# Patient Record
Sex: Male | Born: 1940
Health system: Southern US, Community
[De-identification: ages and names within clinical notes are randomized; demographics above are authoritative.]

## PROBLEM LIST (undated history)

## (undated) DIAGNOSIS — K219 Gastro-esophageal reflux disease without esophagitis: Secondary | ICD-10-CM

## (undated) DIAGNOSIS — K429 Umbilical hernia without obstruction or gangrene: Secondary | ICD-10-CM

## (undated) DIAGNOSIS — I2510 Atherosclerotic heart disease of native coronary artery without angina pectoris: Secondary | ICD-10-CM

## (undated) DIAGNOSIS — I739 Peripheral vascular disease, unspecified: Secondary | ICD-10-CM

## (undated) DIAGNOSIS — I779 Disorder of arteries and arterioles, unspecified: Secondary | ICD-10-CM

## (undated) DIAGNOSIS — E785 Hyperlipidemia, unspecified: Secondary | ICD-10-CM

## (undated) DIAGNOSIS — Z8711 Personal history of peptic ulcer disease: Secondary | ICD-10-CM

## (undated) DIAGNOSIS — N489 Disorder of penis, unspecified: Secondary | ICD-10-CM

## (undated) DIAGNOSIS — Z8719 Personal history of other diseases of the digestive system: Secondary | ICD-10-CM

## (undated) DIAGNOSIS — Z87828 Personal history of other (healed) physical injury and trauma: Secondary | ICD-10-CM

## (undated) DIAGNOSIS — E669 Obesity, unspecified: Secondary | ICD-10-CM

## (undated) DIAGNOSIS — Z8739 Personal history of other diseases of the musculoskeletal system and connective tissue: Secondary | ICD-10-CM

## (undated) DIAGNOSIS — I10 Essential (primary) hypertension: Secondary | ICD-10-CM

## (undated) HISTORY — DX: Obesity, unspecified: E66.9

## (undated) HISTORY — PX: CORONARY ANGIOPLASTY WITH STENT PLACEMENT: SHX49

## (undated) HISTORY — DX: Atherosclerotic heart disease of native coronary artery without angina pectoris: I25.10

## (undated) HISTORY — PX: OTHER SURGICAL HISTORY: SHX169

## (undated) HISTORY — DX: Essential (primary) hypertension: I10

## (undated) HISTORY — PX: CARDIOVASCULAR STRESS TEST: SHX262

---

## 1997-07-23 HISTORY — PX: OTHER SURGICAL HISTORY: SHX169

## 2010-08-21 HISTORY — PX: TRANSTHORACIC ECHOCARDIOGRAM: SHX275

## 2011-05-18 ENCOUNTER — Encounter: Payer: Self-pay | Admitting: Cardiovascular Disease

## 2011-05-21 ENCOUNTER — Ambulatory Visit (INDEPENDENT_AMBULATORY_CARE_PROVIDER_SITE_OTHER): Payer: 59 | Admitting: Cardiovascular Disease

## 2011-05-21 ENCOUNTER — Encounter: Payer: Self-pay | Admitting: Cardiovascular Disease

## 2011-05-21 DIAGNOSIS — I10 Essential (primary) hypertension: Secondary | ICD-10-CM

## 2011-05-21 DIAGNOSIS — I2510 Atherosclerotic heart disease of native coronary artery without angina pectoris: Secondary | ICD-10-CM | POA: Insufficient documentation

## 2011-05-21 DIAGNOSIS — E7800 Pure hypercholesterolemia, unspecified: Secondary | ICD-10-CM

## 2011-05-21 DIAGNOSIS — E785 Hyperlipidemia, unspecified: Secondary | ICD-10-CM

## 2011-05-21 NOTE — Progress Notes (Signed)
HPI:  This is a 70 year old gentleman presenting to establish care. The patient has coronary artery disease and underwent PCI earlier this year. I don't have details of this but he tells me he was treated with a drug-eluting stent. This was done in [facility]. The patient presented with exertional dyspnea and he underwent stress testing by his primary cardiologist in [facility]. Apparently the stress test was abnormal and he underwent cardiac catheterization and PCI. He was told he had a second blockage that may need treatment, but it sounds by his description that it was a small blood vessel involved.  The patient denies exertional chest pain or pressure. He has not appreciated any significant improvement in his breathing since his PCI procedure was done. He denies edema, palpitations, orthopnea, PND, lightheadedness, or syncope. He reports good control of his blood pressure and cholesterol. Home blood pressures have ranged in the 120s and 130s over 80s. He denies any recent change in his medications and he reports good compliance with taking his medicines. He does not participate in any regular exercise.  Outpatient Encounter Prescriptions as of 05/21/2011  Medication Sig Dispense Refill  . amLODipine (NORVASC) 5 MG tablet Take 5 mg by mouth daily.        Marland Kitchen aspirin 81 MG tablet Take 81 mg by mouth daily.        . carvedilol (COREG) 12.5 MG tablet Take 12.5 mg by mouth 2 (two) times daily with a meal.        . glimepiride (AMARYL) 4 MG tablet Take 4 mg by mouth daily before breakfast.        . lisinopril (PRINIVIL,ZESTRIL) 20 MG tablet Take 20 mg by mouth 2 (two) times daily.        . metFORMIN (GLUCOPHAGE-XR) 500 MG 24 hr tablet Take 500 mg by mouth 3 (three) times daily.        . nitroGLYCERIN (NITROSTAT) 0.4 MG SL tablet Place 0.4 mg under the tongue every 5 (five) minutes as needed.        Marland Kitchen omeprazole (PRILOSEC) 40 MG capsule Take 40 mg by mouth daily.        . prasugrel  (EFFIENT) 10 MG TABS Take 10 mg by mouth.        . simvastatin (ZOCOR) 20 MG tablet Take 20 mg by mouth at bedtime.        . colchicine 0.6 MG tablet Take 0.6 mg by mouth as needed.         Review of patient's allergies indicates no known allergies.  Past Medical History  Diagnosis Date  . Diabetes mellitus     type 2  . Hypertension   . Obesity   . Esophageal reflux   . Coronary artery disease     Past Surgical History  Procedure Date  . Coronary angioplasty with stent placement   . Coronary stent placement     History   Social History  . Marital Status: Married    Spouse Name: N/A    Number of Children: N/A  . Years of Education: N/A   Occupational History  . Not on file.   Social History Main Topics  . Smoking status: Never Smoker   . Smokeless tobacco: Not on file  . Alcohol Use: No  . Drug Use: No  . Sexually Active: Not on file   Other Topics Concern  . Not on file   Social History Narrative  . No narrative on file  No family history on file.  ROS: General: no fevers/chills/night sweats Eyes: no blurry vision, diplopia, or amaurosis ENT: no sore throat or hearing loss Resp: no cough, wheezing, or hemoptysis CV: no edema or palpitations GI: no abdominal pain, nausea, vomiting, diarrhea, or constipation GU: no dysuria, frequency, or hematuria Skin: no rash Neuro: no headache, numbness, tingling, or weakness of extremities Musculoskeletal: no joint pain or swelling Heme: no bleeding, DVT, or easy bruising Endo: no polydipsia or polyuria  BP 170/80  Pulse 58  Resp 18  Ht 5\' 8"  (1.727 m)  Wt 222 lb 12.8 oz (101.061 kg)  BMI 33.88 kg/m2  PHYSICAL EXAM: Pt is alert and oriented, WD, WN, very nice overweight male in no distress. HEENT: normal Neck: JVP normal. Carotid upstrokes normal without bruits. No thyromegaly. Lungs: equal expansion, clear bilaterally CV: Apex is discrete and nondisplaced, RRR without murmur or gallop Abd: soft, NT,  +BS, no bruit, no hepatosplenomegaly Back: no CVA tenderness Ext: no C/C/E        Femoral pulses 2+= without bruits        DP/PT pulses intact and = Skin: warm and dry without rash Neuro: CNII-XII intact             Strength intact = bilaterally  EKG:  Sinus bradycardia 58 beats per minute, within normal limits.  ASSESSMENT AND PLAN:

## 2011-05-21 NOTE — Assessment & Plan Note (Signed)
Lipids were reviewed from Dr. Elenor Legato office. Cholesterol is 126, triglycerides 139, HDL 28, and LDL 70. He should continue on simvastatin as his LDL is at goal. There is really no good data for additional pharmacotherapy to raise HDL in patients already on a statin drug.

## 2011-05-21 NOTE — Assessment & Plan Note (Signed)
Office blood pressure is elevated but patient notes good readings at home. He is on a combination of amlodipine, carvedilol, and lisinopril.

## 2011-05-21 NOTE — Assessment & Plan Note (Signed)
The patient is on appropriate medical program. He appears to have good control of his cardiac risk factors. His blood pressure is elevated he office today but his home readings appear to be in a good range. I suspect he has a component of whitecoat hypertension (note this is his first visit here).    The We will send for his outside records. We have requested office records and stress test results from his primary cardiologist in [place], [place]. We have also requested catheterization reports and films from his PCI procedure from the interventional cardiologist in [place]. I would like to see the patient back for followup evaluation after the studies can be reviewed.

## 2011-05-21 NOTE — Patient Instructions (Signed)
Your physician recommends that you schedule a follow-up appointment in: January 2013  Your physician recommends that you continue on your current medications as directed. Please refer to the Current Medication list given to you today.

## 2011-05-23 ENCOUNTER — Telehealth: Payer: Self-pay | Admitting: Cardiovascular Disease

## 2011-05-23 NOTE — Telephone Encounter (Addendum)
ROi faxed to [facility] @ [phone], spoke with Morrie Sheldon she will fax records 05/23/11/km  Records received from [facility] gave to [facility]  05/23/11/km  ROI faxed to [facility] @ [phone]  05/28/11/km  Records received from [facility] gave to [place]  06/01/11/km

## 2011-07-30 ENCOUNTER — Encounter: Payer: Self-pay | Admitting: Cardiovascular Disease

## 2011-07-30 ENCOUNTER — Ambulatory Visit (INDEPENDENT_AMBULATORY_CARE_PROVIDER_SITE_OTHER): Payer: Medicare Other | Admitting: Cardiovascular Disease

## 2011-07-30 DIAGNOSIS — I2510 Atherosclerotic heart disease of native coronary artery without angina pectoris: Secondary | ICD-10-CM

## 2011-07-30 DIAGNOSIS — I10 Essential (primary) hypertension: Secondary | ICD-10-CM

## 2011-07-30 MED ORDER — AMLODIPINE BESYLATE 10 MG PO TABS: 10.0000 mg | ORAL_TABLET | Freq: Every day | ORAL | Status: DC

## 2011-07-30 NOTE — Progress Notes (Signed)
HPI:  This is a 71 year old gentleman presenting for followup evaluation. The patient has coronary artery disease and underwent coronary stenting and 2011. He is approaching one year out from his PCI procedure went he was treated with a drug-eluting stent and large obtuse marginal branch of the left circumflex. The patient was also noted to have moderately tight stenosis of a diagonal branch and medical therapy was recommended.  The patient continues to do well from a symptomatic perspective. He denies chest pain or pressure. He has mild dyspnea which is chronic and unchanged. He denies edema, orthopnea, or PND. He does home blood pressures with his wrist cuff had been in a good range with systolic readings less than 140.  I have received records from his cardiologist in [place] and these have been reviewed. The patient had a stress test in February 2012 demonstrating posterolateral ischemia and underwent PCI of the left circumflex as outlined above. We had requested that a catheterization films but these have not been received to date.  Outpatient Encounter Prescriptions as of 07/30/2011  Medication Sig Dispense Refill  . amLODipine (NORVASC) 5 MG tablet Take 5 mg by mouth daily.        Marland Kitchen aspirin 81 MG tablet Take 81 mg by mouth daily.        . carvedilol (COREG) 12.5 MG tablet Take 12.5 mg by mouth 2 (two) times daily with a meal.        . colchicine 0.6 MG tablet Take 0.6 mg by mouth as needed.       Marland Kitchen glimepiride (AMARYL) 4 MG tablet Take 4 mg by mouth daily before breakfast.        . lisinopril (PRINIVIL,ZESTRIL) 20 MG tablet Take 20 mg by mouth 2 (two) times daily.        . metFORMIN (GLUCOPHAGE-XR) 500 MG 24 hr tablet Take 500 mg by mouth 3 (three) times daily.        . nitroGLYCERIN (NITROSTAT) 0.4 MG SL tablet Place 0.4 mg under the tongue every 5 (five) minutes as needed.        Marland Kitchen omeprazole (PRILOSEC) 40 MG capsule Take 40 mg by mouth daily.        . prasugrel (EFFIENT) 10 MG TABS  Take 10 mg by mouth.        . simvastatin (ZOCOR) 20 MG tablet Take 20 mg by mouth at bedtime.          No Known Allergies  Past Medical History  Diagnosis Date  . Diabetes mellitus     type 2  . Hypertension   . Obesity   . Esophageal reflux   . Coronary artery disease     ROS: Negative except as per HPI  BP 150/80  Pulse 58  Ht 5' 7.5" (1.715 m)  Wt 103.329 kg (227 lb 12.8 oz)  BMI 35.15 kg/m2  PHYSICAL EXAM: Pt is alert and oriented, overweight male in NAD HEENT: normal Neck: JVP - normal, carotids 2+= without bruits Lungs: CTA bilaterally CV: RRR without murmur or gallop Abd: soft, NT, Positive BS, no hepatomegaly Ext: no C/C/E, distal pulses intact and equal Skin: warm/dry no rash  ASSESSMENT AND PLAN:

## 2011-07-30 NOTE — Assessment & Plan Note (Signed)
The patient is stable without anginal symptoms. He will continue effient to complete 12 months of therapy and then can discontinue this medication. He should remain on aspirin 81 mg. We sent for his catheterization films again. I will plan on seeing him back in 12 months for followup evaluation.

## 2011-07-30 NOTE — Assessment & Plan Note (Signed)
Blood pressure is elevated on consecutive office visits. I recommended that he increase amlodipine from 5-10 mg daily. We also recommended that he obtain an arm cuff for home monitoring.

## 2011-07-30 NOTE — Patient Instructions (Signed)
Your physician has recommended you make the following change in your medication: INCREASE Amlodipine to 10mg  once a day, You can stop EFFIENT when you finish all of the refills on your bottle  Your physician has requested that you regularly monitor and record your blood pressure readings at home. Please use the same machine at the same time of day to check your readings and record them to bring to your follow-up visit.  Please get a BP cuff to use on your arm, not the wrist.   Your physician wants you to follow-up in: 1 YEAR.  You will receive a reminder letter in the mail two months in advance. If you don't receive a letter, please call our office to schedule the follow-up appointment.

## 2011-07-31 ENCOUNTER — Telehealth: Payer: Self-pay | Admitting: Cardiovascular Disease

## 2011-07-31 NOTE — Telephone Encounter (Addendum)
ROI faxed to Michelle/Cardiac Cath Dept @ [phone] to [facility] Last Cath to be put on CD for [facility] Review 07/31/11/KM  Cd received of Cath Gave to Lauren 08/01/11/KM

## 2011-08-14 LAB — HM COLONOSCOPY: HM COLON: NORMAL

## 2011-10-16 ENCOUNTER — Telehealth: Payer: Self-pay | Admitting: Cardiovascular Disease

## 2011-10-16 DIAGNOSIS — I10 Essential (primary) hypertension: Secondary | ICD-10-CM

## 2011-10-16 DIAGNOSIS — I2510 Atherosclerotic heart disease of native coronary artery without angina pectoris: Secondary | ICD-10-CM

## 2011-10-16 MED ORDER — AMLODIPINE BESYLATE 5 MG PO TABS: 5.0000 mg | ORAL_TABLET | Freq: Every day | ORAL | Status: DC

## 2011-10-16 NOTE — Telephone Encounter (Signed)
I spoke with the pt and on average his BP is running 120-135/80.  The pt cannot tolerate the swelling in his lower extremities since increasing his amlodipine dosage.  The pt would like to go back to Amlodipine 5mg  daily.  New Rx sent to pharmacy due to the pt having difficulty cutting the 10mg .  I instructed the pt that he needs to monitor his BP at home and he will call back next week to let us know how his BP is running.  I made the pt aware that we would like his BP consistently below 140/90 and that he may require further adjustments in his BP medications. Pt agreed with plan.

## 2011-10-16 NOTE — Telephone Encounter (Signed)
New Msg: Pt calling wanting to speak with nurse/MD regarding pt recent amlodipine increase and feet swelling. Pt wants to know if MD can decrease RX back down to 5 mg. If MD approved pt to decrease amlodipine back down to 5mg , then pt needs new RX called into pharmacy.  Please return pt call to discuss further

## 2011-11-05 ENCOUNTER — Telehealth: Payer: Self-pay | Admitting: Cardiovascular Disease

## 2011-11-05 MED ORDER — CARVEDILOL 25 MG PO TABS: 25.0000 mg | ORAL_TABLET | Freq: Two times a day (BID) | ORAL | Status: DC

## 2011-11-05 NOTE — Telephone Encounter (Addendum)
Called patient and he reported the following BP readings: 4/12 165/70 172/74 162/68 4/13 171/68 4/14 175/83 146/70  Heart rates are in the mid 70's. Advised will discuss with MD and call him back.  Discussed above with Dr.Brackbill (DOD) and he advised that patient increase Coreg to 25mg  2 times per day and call us back with BP and HR readings in a few weeks. Patient verbalized understanding of the above information. Will let Dr.Cooper know per patient's request.

## 2011-11-05 NOTE — Telephone Encounter (Signed)
New Problem:     Patient called in to give his BP readings to Lauren.  Please call back.

## 2011-12-19 ENCOUNTER — Telehealth: Payer: Self-pay | Admitting: Cardiovascular Disease

## 2011-12-19 DIAGNOSIS — I10 Essential (primary) hypertension: Secondary | ICD-10-CM

## 2011-12-19 MED ORDER — HYDROCHLOROTHIAZIDE 25 MG PO TABS: 12.5000 mg | ORAL_TABLET | Freq: Every day | ORAL | Status: DC

## 2011-12-19 NOTE — Telephone Encounter (Signed)
I will forward this message to Dr Excell Seltzer to review BP readings. Per 11/05/11 phone note Dr Patty Sermons increased the pt's Coreg to 25mg  twice a day and instructed the pt to continue to monitor BP.

## 2011-12-19 NOTE — Telephone Encounter (Signed)
Pt did not get dates but he said he will start from today and you can go backwards  144/68 154/74 160/65 159/71 171/75 [id number] 150/71 145/66 147/70 158/68 160/70 145/66 136/73 b/p monitor reports heart skipping 166/72 157/71 151/73 b/p monitor reports heart skipping 168/72

## 2011-12-19 NOTE — Telephone Encounter (Signed)
Blood pressure readings are consistently elevated. He has not tolerated increased doses of amlodipine. Recommend add hydrochlorothiazide 12.5 mg daily. The patient needs to be counseled about potential for worsening of gout, but okay to start this medicine as long as his gout is controlled. He should have a metabolic panel checked again in 2 weeks.

## 2011-12-19 NOTE — Telephone Encounter (Signed)
I spoke with the pt and made him aware that Dr Excell Seltzer would like him to start HCTZ 12.5mg  daily.  The pt said he has not had a flare-up of gout in over a year.  The pt will start HCTZ and have a BMP checked on 01/03/12.  Rx sent to pharmacy. The pt will continue to monitor his BP at home.

## 2012-01-03 ENCOUNTER — Other Ambulatory Visit (INDEPENDENT_AMBULATORY_CARE_PROVIDER_SITE_OTHER): Payer: Medicare Other

## 2012-01-03 DIAGNOSIS — I10 Essential (primary) hypertension: Secondary | ICD-10-CM

## 2012-01-03 LAB — BASIC METABOLIC PANEL
CO2: 24 mEq/L (ref 19–32)
Calcium: 8.9 mg/dL (ref 8.4–10.5)
GFR: 89.06 mL/min (ref >60.00)
Potassium: 4.1 mEq/L (ref 3.5–5.1)
Sodium: 141 mEq/L (ref 135–145)

## 2012-01-14 ENCOUNTER — Telehealth: Payer: Self-pay

## 2012-01-14 DIAGNOSIS — I10 Essential (primary) hypertension: Secondary | ICD-10-CM

## 2012-01-14 NOTE — Telephone Encounter (Signed)
I spoke with the pt about his BMP results.  The pt's BP is still elevated on HCTZ: 6/24 151/66 6/22 139/65 6/21 172/72 6/20 149/69 6/11 142/71 6/9 151/70 6/7 140/65 and 161/74 6/6 156/68 6/4 161/77 6/2 162/75  I instructed the pt to increase his HCTZ to 25mg  daily.  The pt will continue to monitor his BP at home and call the office back in 2 weeks with BP readings.  Pt agreed with plan.

## 2012-01-14 NOTE — Telephone Encounter (Signed)
Agree with plan - thx.  Tonny Bollman 01/14/2012 9:37 PM

## 2012-02-12 ENCOUNTER — Telehealth: Payer: Self-pay | Admitting: Cardiovascular Disease

## 2012-02-12 DIAGNOSIS — I10 Essential (primary) hypertension: Secondary | ICD-10-CM

## 2012-02-12 MED ORDER — HYDROCHLOROTHIAZIDE 25 MG PO TABS: 25.0000 mg | ORAL_TABLET | Freq: Every day | ORAL | Status: DC

## 2012-02-12 NOTE — Telephone Encounter (Signed)
He is maxed on beta-blocker, ACE, and HCTZ and cannot tolerate a higher dose of amlodipine. Recommend schedule a routine OV to review med options from here.

## 2012-02-12 NOTE — Telephone Encounter (Signed)
New msg Pt wants to talk to you about his BP

## 2012-02-12 NOTE — Telephone Encounter (Signed)
I spoke with the pt and his BP has improved since increasing HCTZ to 25mg  daily.    BP Readings: 146/62 155/67 153/67 129/62 152/64 157/64 165/74 143/68 164/69 159/64   The pt's BP still remains above 140/90.  I will forward this information to Dr Excell Seltzer to make further recommendations.

## 2012-02-25 NOTE — Telephone Encounter (Signed)
Pt scheduled to see Dr Excell Seltzer on 03/04/12 to discuss medications.

## 2012-02-28 ENCOUNTER — Other Ambulatory Visit: Payer: Self-pay | Admitting: Urology

## 2012-03-04 ENCOUNTER — Encounter: Payer: Self-pay | Admitting: Cardiovascular Disease

## 2012-03-04 ENCOUNTER — Ambulatory Visit (INDEPENDENT_AMBULATORY_CARE_PROVIDER_SITE_OTHER): Payer: Medicare Other | Admitting: Cardiovascular Disease

## 2012-03-04 VITALS — BP 122/68 | HR 56 | Ht 67.0 in | Wt 230.0 lb

## 2012-03-04 DIAGNOSIS — E785 Hyperlipidemia, unspecified: Secondary | ICD-10-CM

## 2012-03-04 DIAGNOSIS — I2510 Atherosclerotic heart disease of native coronary artery without angina pectoris: Secondary | ICD-10-CM

## 2012-03-04 DIAGNOSIS — I10 Essential (primary) hypertension: Secondary | ICD-10-CM

## 2012-03-04 NOTE — Assessment & Plan Note (Signed)
The patient is stable without anginal symptoms. He is on appropriate medical program except he has stopped his statin drug because he was concerned that it may adversely affect his memory. I discussed this with him and since he has had no memory impairment or problems related to cognitive dysfunction, he is willing to restart simvastatin 20 mg daily. He will followup with Dr. Leonor Liv for continued treatment of his hyperlipidemia and monitoring of his lipids and LFTs.

## 2012-03-04 NOTE — Progress Notes (Signed)
   HPI:  71 year old gentleman presenting for followup of coronary artery disease. He underwent coronary stenting in 2011 when he was diagnosed with severe stenosis of an obtuse marginal branch the left circumflex. He also had a moderate lesion in the diagonal branch and this was treated medically. His main problem over the last several months has been related to blood pressure control. His blood pressure has remained consistently elevated despite multiple antihypertensive medications. He brings in readings today with systolics ranging from 129-168 and diastolics ranging from 60-74. Overall he feels well. He denies chest pain or pressure. He denies orthopnea, PND, palpitations, weakness, or edema. He has chronic exertional dyspnea unchanged over time.  Outpatient Encounter Prescriptions as of 03/04/2012  Medication Sig Dispense Refill  . amLODipine (NORVASC) 5 MG tablet Take 1 tablet (5 mg total) by mouth daily.  30 tablet  11  . aspirin 81 MG tablet Take 81 mg by mouth daily.        . carvedilol (COREG) 25 MG tablet Take 1 tablet (25 mg total) by mouth 2 (two) times daily.  60 tablet  11  . colchicine 0.6 MG tablet Take 0.6 mg by mouth as needed.       Marland Kitchen glimepiride (AMARYL) 4 MG tablet Take 4 mg by mouth daily before breakfast.        . hydrochlorothiazide (HYDRODIURIL) 25 MG tablet Take 1 tablet (25 mg total) by mouth daily.  90 tablet  3  . lisinopril (PRINIVIL,ZESTRIL) 20 MG tablet Take 20 mg by mouth 2 (two) times daily.        . metFORMIN (GLUCOPHAGE-XR) 500 MG 24 hr tablet Take 500 mg by mouth 3 (three) times daily.        Marland Kitchen omeprazole (PRILOSEC) 40 MG capsule Take 40 mg by mouth daily.        . Tamsulosin HCl (FLOMAX) 0.4 MG CAPS Take 1 tablet by mouth daily.      . vitamin C (ASCORBIC ACID) 500 MG tablet Take 500 mg by mouth daily.      Marland Kitchen DISCONTD: nitroGLYCERIN (NITROSTAT) 0.4 MG SL tablet Place 0.4 mg under the tongue every 5 (five) minutes as needed.        Marland Kitchen DISCONTD: prasugrel  (EFFIENT) 10 MG TABS Take 10 mg by mouth.        . DISCONTD: simvastatin (ZOCOR) 20 MG tablet Take 20 mg by mouth at bedtime.          No Known Allergies  Past Medical History  Diagnosis Date  . Diabetes mellitus     type 2  . Hypertension   . Obesity   . Esophageal reflux   . Coronary artery disease     ROS: Negative except as per HPI  BP 122/68  Pulse 56  Ht 5\' 7"  (1.702 m)  Wt 104.327 kg (230 lb)  BMI 36.02 kg/m2  PHYSICAL EXAM: Pt is alert and oriented, overweight male, very pleasant, in NAD HEENT: normal Neck: JVP - normal, carotids 2+= without bruits Lungs: CTA bilaterally CV: RRR without murmur or gallop Abd: soft, NT, Positive BS, no hepatomegaly Ext: no C/C/E, distal pulses intact and equal Skin: warm/dry no rash  EKG:  Sinus bradycardia 56 beats per minute, PACs noted, otherwise within normal limits  ASSESSMENT AND PLAN:

## 2012-03-04 NOTE — Patient Instructions (Signed)
Please come into the office on 03/11/12 at 9:30 for a BP check (bring your home BP cuff)--ask for Kennia Vanvorst  Please check your BP three times a week and continue to keep a record of readings.  Your physician wants you to follow-up in: 1 YEAR with Dr Excell Seltzer.  You will receive a reminder letter in the mail two months in advance. If you don't receive a letter, please call our office to schedule the follow-up appointment.  Your physician recommends that you continue on your current medications as directed. Please refer to the Current Medication list given to you today.

## 2012-03-04 NOTE — Assessment & Plan Note (Signed)
Home blood pressures remain elevated. His initial blood pressure here was 122/68. However, my recheck his blood pressure was 155/80. I've asked him to bring his pressure cuff in for calibration in our office. For now, he will continue on his same medicines which include amlodipine, carvedilol, hydrochlorothiazide, and lisinopril. I asked him to continue home blood pressure checks 3 times per week and we will make further adjustments based on those readings.

## 2012-03-04 NOTE — Assessment & Plan Note (Signed)
Discussion as above. Resume simvastatin 20 mg daily.

## 2012-03-18 ENCOUNTER — Encounter (HOSPITAL_BASED_OUTPATIENT_CLINIC_OR_DEPARTMENT_OTHER): Payer: Self-pay | Admitting: *Deleted

## 2012-03-18 NOTE — Progress Notes (Signed)
NPO AFTER MN. ARRIVES AT 0615. NEEDS ISTAT. CURRENT EKG IN EPIC AND CHART. WILL TAKE COREG AND NORVASC AM OF SURG W/ SIP OF WATER.

## 2012-03-20 NOTE — H&P (Signed)
History of Present Illness            BPH with outlet obstruction: He was seen by his primary care physician and was told he had some swelling of his prostate. After hearing his voiding symptoms which consist of frequency, urgency, nocturia, hesitancy, weak stream, intermittency and straining to urinate he was placed on Flomax. He has seen improvement in his voiding symptoms. He has no prior history of prostate problems and no family history of prostate cancer. His PSA done by Dr. Leonor Liv in 5/13 was 1.5. IPSS (02/26/12): 31, mixed  Glans lesion: He reports he was uncircumcised and as noted, for several years, an area of redness underneath the foreskin. He said that it doesn't itch but he said occasionally he'll have some slight burning in that area. It really hasn't progressed but he says it may have changed slightly over time in a waxing and waning fashion but is not sure. He's never had any treatment for this. He wanted me to take a look at it today. He was concerned because he read on the Internet it could be due to a sexually transmitted disease.  Hemospermia: He has not been sexually active but masturbated and noted blood in the semen. He had no pain with ejaculation and has no prior history of prostatitis. He reports his PSA was normal when it was checked recently by his primary care physician   Interval history: He use the antifungal as I had prescribed for 2 weeks and noted no change whatsoever. He's not having any discomfort. No change in his voiding pattern has been noted.   Past Medical History Problems  1. History of  Diabetes Mellitus 250.00 2. History of  Esophageal Reflux 530.81 3. History of  Heart Disease 429.9 4. History of  Hypercholesterolemia 272.0 5. History of  Hypertension 401.9  Surgical History Problems  1. History of  No Surgical Problems  Current Meds 1. AmLODIPine Besylate 10 MG Oral Tablet; Therapy: 07Jan2013 to 2. AmLODIPine Besylate 5 MG Oral Tablet; Therapy:  26Mar2013 to 3. Aspirin 81 MG Oral Tablet; Therapy: (Recorded:09Jul2013) to 4. Carvedilol 25 MG Oral Tablet; Therapy: 15Apr2013 to 5. Clotrimazole-Betamethasone 1-0.05 % External Cream; APPLY  AND RUB  IN A THIN FILM TO  AFFECTED AREAS TWICE DAILY.(AM AND PM); Therapy: 09Jul2013 to (Last Rx:09Jul2013)   Requested for: 09Jul2013 6. Glimepiride 4 MG Oral Tablet; Therapy: 14Aug2012 to 7. Hydrochlorothiazide 25 MG Oral Tablet; Therapy: 29May2013 to 8. Lisinopril 20 MG Oral Tablet; Therapy: 22Jan2013 to 9. MetFORMIN HCl 500 MG Oral Tablet; Therapy: 27Sep2012 to 10. Omeprazole 40 MG Oral Capsule Delayed Release; Therapy: 22Jan2013 to 11. Tamsulosin HCl 0.4 MG Oral Capsule; Therapy: (Recorded:09Jul2013) to  Allergies Medication  1. No Known Drug Allergies  Family History Problems  1. Family history of  No Significant Family History  Social History Problems  1. Caffeine Use 2. Never A Smoker 3. Occupation: Retired Denied  4. History of  Alcohol Use  Review of Systems Genitourinary, constitutional and gastrointestinal system(s) were reviewed and pertinent findings if present are noted.  Genitourinary: penile lesion.      Review of Systems Genitourinary, constitutional, skin, eye, otolaryngeal, hematologic/lymphatic, cardiovascular, pulmonary, endocrine, musculoskeletal, gastrointestinal, neurological and psychiatric system(s) were reviewed and pertinent findings if present are noted.  Genitourinary: urinary frequency, feelings of urinary urgency, nocturia, difficulty starting the urinary stream, weak urinary stream, urinary stream starts and stops, post-void dribbling, hematuria and initiating urination requires straining, but no dysuria.  Gastrointestinal: heartburn and diarrhea.  Integumentary:  skin rash/lesion and pruritus.  ENT: sinus problems.  Respiratory: shortness of breath and cough.  Endocrine: polydipsia.  Neurological: headache.    Vitals Vital Signs BMI Calculated:  36.89 BSA Calculated: 2.16 Height: 5 ft 7 in Weight: 235 lb  Blood Pressure: 158 / 74 Temperature: 97.2 F Heart Rate: 53  Physical Exam Constitutional: Well nourished and well developed . No acute distress.  ENT:. The ears and nose are normal in appearance.  Neck: The appearance of the neck is normal and no neck mass is present.  Pulmonary: No respiratory distress and normal respiratory rhythm and effort.  Cardiovascular: Heart rate and rhythm are normal . No peripheral edema.  Abdomen: The abdomen is soft and nontender. No masses are palpated. No CVA tenderness. No hernias are palpable. No hepatosplenomegaly noted.  Rectal: Rectal exam demonstrates normal sphincter tone, no tenderness and no masses. Estimated prostate size is 1+. There is obliteration of the median furrow. The prostate has no nodularity and is not tender. The left seminal vesicle is nonpalpable. The right seminal vesicle is nonpalpable. The perineum is normal on inspection.  Genitourinary: Examination of the penis demonstrates no discharge, no masses and a normal meatus. The penis is uncircumcised. The scrotum is without lesions. The right epididymis is palpably normal and non-tender. The left epididymis is palpably normal and non-tender. The right testis is non-tender and without masses. The left testis is non-tender and without masses. There is a red irregular area involving the glans and portion of the inner preputial skin on the proximal portion of the glans to the left-hand side.  Lymphatics: The femoral and inguinal nodes are not enlarged or tender.  Skin: Normal skin turgor, no visible rash and no visible skin lesions.  Neuro/Psych:. Mood and affect are appropriate.   Assessment Assessed  1. Rule out  [place] Erythroplasia Of The Penis 233.5 2. Benign Prostatic Hypertrophy With Urinary Obstruction 600.01   The area on his glands is unchanged and is concerning for squamous cell carcinoma in situ. I discussed that  with the patient today and the need for further evaluation. We discussed performing a biopsy versus circumcision but since the area involves the glans circumcision would not excise all of the lesion and therefore is not necessarily indicated. The patient did not want to proceed with a circumcision and therefore I have discussed proceeding with a biopsy. I told him that I would biopsy the glans and the possibly the foreskin but would make that determination at the time of surgery. He would be done under anesthesia as an outpatient. I've answered all of his questions and he has elected to proceed.   Plan    1. He's going to stop the Lotrisone cream. 2. I've asked him to stop his aspirin until after the surgery. 3. He is scheduled for a penile biopsy.

## 2012-03-21 ENCOUNTER — Encounter (HOSPITAL_BASED_OUTPATIENT_CLINIC_OR_DEPARTMENT_OTHER): Payer: Self-pay | Admitting: Anesthesiology

## 2012-03-21 ENCOUNTER — Encounter (HOSPITAL_BASED_OUTPATIENT_CLINIC_OR_DEPARTMENT_OTHER)
Admission: RE | Disposition: A | Discharge status: Home / Self Care | Payer: Self-pay | Source: Ambulatory Visit | Attending: Urology

## 2012-03-21 ENCOUNTER — Ambulatory Visit (HOSPITAL_BASED_OUTPATIENT_CLINIC_OR_DEPARTMENT_OTHER): Payer: Medicare Other | Admitting: Anesthesiology

## 2012-03-21 ENCOUNTER — Ambulatory Visit (HOSPITAL_BASED_OUTPATIENT_CLINIC_OR_DEPARTMENT_OTHER)
Admission: RE | Admit: 2012-03-21 | Discharge: 2012-03-21 | Disposition: A | Discharge status: Home / Self Care | Payer: Medicare Other | Source: Ambulatory Visit | Attending: Urology | Admitting: Urology

## 2012-03-21 ENCOUNTER — Encounter (HOSPITAL_BASED_OUTPATIENT_CLINIC_OR_DEPARTMENT_OTHER): Payer: Self-pay | Admitting: *Deleted

## 2012-03-21 DIAGNOSIS — K219 Gastro-esophageal reflux disease without esophagitis: Secondary | ICD-10-CM | POA: Insufficient documentation

## 2012-03-21 DIAGNOSIS — N401 Enlarged prostate with lower urinary tract symptoms: Secondary | ICD-10-CM | POA: Insufficient documentation

## 2012-03-21 DIAGNOSIS — N480 Leukoplakia of penis: Secondary | ICD-10-CM | POA: Insufficient documentation

## 2012-03-21 DIAGNOSIS — N138 Other obstructive and reflux uropathy: Secondary | ICD-10-CM | POA: Insufficient documentation

## 2012-03-21 DIAGNOSIS — N489 Disorder of penis, unspecified: Secondary | ICD-10-CM | POA: Diagnosis present

## 2012-03-21 DIAGNOSIS — I10 Essential (primary) hypertension: Secondary | ICD-10-CM | POA: Insufficient documentation

## 2012-03-21 DIAGNOSIS — E119 Type 2 diabetes mellitus without complications: Secondary | ICD-10-CM | POA: Insufficient documentation

## 2012-03-21 DIAGNOSIS — E7800 Pure hypercholesterolemia, unspecified: Secondary | ICD-10-CM | POA: Insufficient documentation

## 2012-03-21 DIAGNOSIS — Z79899 Other long term (current) drug therapy: Secondary | ICD-10-CM | POA: Insufficient documentation

## 2012-03-21 HISTORY — DX: Disorder of penis, unspecified: N48.9

## 2012-03-21 HISTORY — DX: Umbilical hernia without obstruction or gangrene: K42.9

## 2012-03-21 HISTORY — DX: Peripheral vascular disease, unspecified: I73.9

## 2012-03-21 HISTORY — DX: Hyperlipidemia, unspecified: E78.5

## 2012-03-21 HISTORY — DX: Personal history of other (healed) physical injury and trauma: Z87.828

## 2012-03-21 HISTORY — DX: Personal history of peptic ulcer disease: Z87.11

## 2012-03-21 HISTORY — DX: Personal history of other diseases of the musculoskeletal system and connective tissue: Z87.39

## 2012-03-21 HISTORY — DX: Disorder of arteries and arterioles, unspecified: I77.9

## 2012-03-21 HISTORY — DX: Personal history of other diseases of the digestive system: Z87.19

## 2012-03-21 HISTORY — DX: Gastro-esophageal reflux disease without esophagitis: K21.9

## 2012-03-21 LAB — POCT I-STAT, CHEM 8
BUN: 9 mg/dL (ref 6–23)
Calcium, Ion: 1.22 mmol/L (ref 1.13–1.30)
Chloride: 104 mEq/L (ref 96–112)
Creatinine, Ser: 1 mg/dL (ref 0.50–1.35)
Glucose, Bld: 170 mg/dL — ABNORMAL HIGH (ref 70–99)
HCT: 38 % — ABNORMAL LOW (ref 39.0–52.0)
Hemoglobin: 12.9 g/dL — ABNORMAL LOW (ref 13.0–17.0)
Potassium: 3.8 mEq/L (ref 3.5–5.1)
Sodium: 141 mEq/L (ref 135–145)
TCO2: 24 mmol/L (ref 0–100)

## 2012-03-21 LAB — GLUCOSE, CAPILLARY: Glucose-Capillary: 153 mg/dL — ABNORMAL HIGH (ref 70–99)

## 2012-03-21 SURGERY — BIOPSY, PENIS
Anesthesia: General | Site: Penis | Wound class: Clean

## 2012-03-21 MED ORDER — PROMETHAZINE HCL 25 MG/ML IJ SOLN: 6.2500 mg | INTRAMUSCULAR | Status: DC | PRN

## 2012-03-21 MED ORDER — BUPIVACAINE HCL (PF) 0.25 % IJ SOLN
INTRAMUSCULAR | Status: DC | PRN
  Administered 2012-03-21: 7 mL

## 2012-03-21 MED ORDER — BACITRACIN-NEOMYCIN-POLYMYXIN 400-5-5000 EX OINT
TOPICAL_OINTMENT | CUTANEOUS | Status: DC | PRN
  Administered 2012-03-21: 1 via TOPICAL

## 2012-03-21 MED ORDER — FENTANYL CITRATE 0.05 MG/ML IJ SOLN
INTRAMUSCULAR | Status: DC | PRN
  Administered 2012-03-21 (×2): 50 ug via INTRAVENOUS

## 2012-03-21 MED ORDER — LIDOCAINE HCL (CARDIAC) 20 MG/ML IV SOLN
INTRAVENOUS | Status: DC | PRN
  Administered 2012-03-21: 50 mg via INTRAVENOUS

## 2012-03-21 MED ORDER — MIDAZOLAM HCL 5 MG/5ML IJ SOLN
INTRAMUSCULAR | Status: DC | PRN
  Administered 2012-03-21: 1 mg via INTRAVENOUS

## 2012-03-21 MED ORDER — CEFAZOLIN SODIUM-DEXTROSE 2-3 GM-% IV SOLR
2.0000 g | INTRAVENOUS | Status: AC
  Administered 2012-03-21: 2 g via INTRAVENOUS

## 2012-03-21 MED ORDER — FENTANYL CITRATE 0.05 MG/ML IJ SOLN: 25.0000 ug | INTRAMUSCULAR | Status: DC | PRN

## 2012-03-21 MED ORDER — LACTATED RINGERS IV SOLN: INTRAVENOUS | Status: DC

## 2012-03-21 MED ORDER — HYDROCODONE-ACETAMINOPHEN 7.5-325 MG PO TABS: 1.0000 | ORAL_TABLET | ORAL | Status: AC | PRN

## 2012-03-21 MED ORDER — PROPOFOL 10 MG/ML IV BOLUS
INTRAVENOUS | Status: DC | PRN
  Administered 2012-03-21: 150 mg via INTRAVENOUS
  Administered 2012-03-21: 50 mg via INTRAVENOUS

## 2012-03-21 MED ORDER — ONDANSETRON HCL 4 MG/2ML IJ SOLN
INTRAMUSCULAR | Status: DC | PRN
  Administered 2012-03-21: 4 mg via INTRAVENOUS

## 2012-03-21 MED ORDER — DEXAMETHASONE SODIUM PHOSPHATE 4 MG/ML IJ SOLN
INTRAMUSCULAR | Status: DC | PRN
  Administered 2012-03-21: 4 mg via INTRAVENOUS

## 2012-03-21 MED ORDER — LACTATED RINGERS IV SOLN
INTRAVENOUS | Status: DC
  Administered 2012-03-21: 07:00:00 via INTRAVENOUS

## 2012-03-21 SURGICAL SUPPLY — 29 items
BANDAGE CO FLEX L/F 1IN X 5YD (GAUZE/BANDAGES/DRESSINGS) IMPLANT
BANDAGE CO FLEX L/F 2IN X 5YD (GAUZE/BANDAGES/DRESSINGS) IMPLANT
BLADE SURG 15 STRL LF DISP TIS (BLADE) ×1 IMPLANT
BLADE SURG 15 STRL SS (BLADE) ×1
BLADE SURG ROTATE 9660 (MISCELLANEOUS) IMPLANT
CLOTH BEACON ORANGE TIMEOUT ST (SAFETY) ×2 IMPLANT
COVER MAYO STAND STRL (DRAPES) ×2 IMPLANT
COVER TABLE BACK 60X90 (DRAPES) ×2 IMPLANT
DRAPE PED LAPAROTOMY (DRAPES) ×2 IMPLANT
ELECT REM PT RETURN 9FT ADLT (ELECTROSURGICAL) ×2
ELECTRODE REM PT RTRN 9FT ADLT (ELECTROSURGICAL) ×1 IMPLANT
GLOVE BIO SURGEON STRL SZ8 (GLOVE) ×2 IMPLANT
GLOVE ECLIPSE 6.0 STRL STRAW (GLOVE) ×2 IMPLANT
GLOVE INDICATOR 6.5 STRL GRN (GLOVE) ×2 IMPLANT
GOWN PREVENTION PLUS LG XLONG (DISPOSABLE) ×2 IMPLANT
GOWN STRL REIN XL XLG (GOWN DISPOSABLE) ×2 IMPLANT
IV NS 500ML (IV SOLUTION) ×1
IV NS 500ML BAXH (IV SOLUTION) ×1 IMPLANT
NEEDLE HYPO 22GX1.5 SAFETY (NEEDLE) ×2 IMPLANT
NS IRRIG 500ML POUR BTL (IV SOLUTION) IMPLANT
PACK BASIN DAY SURGERY FS (CUSTOM PROCEDURE TRAY) ×2 IMPLANT
PENCIL BUTTON HOLSTER BLD 10FT (ELECTRODE) ×2 IMPLANT
SUT CHROMIC 3 0 SH 27 (SUTURE) IMPLANT
SUT CHROMIC 4 0 RB 1X27 (SUTURE) ×2 IMPLANT
SYR CONTROL 10ML LL (SYRINGE) ×2 IMPLANT
TOWEL NATURAL 6PK STERILE (DISPOSABLE) ×4 IMPLANT
TOWEL OR 17X24 6PK STRL BLUE (TOWEL DISPOSABLE) ×4 IMPLANT
TRAY DSU PREP LF (CUSTOM PROCEDURE TRAY) ×2 IMPLANT
WATER STERILE IRR 500ML POUR (IV SOLUTION) IMPLANT

## 2012-03-21 NOTE — Op Note (Signed)
PATIENT:  Steve Wiggins  PRE-OPERATIVE DIAGNOSIS: Penile lesion  POST-OPERATIVE DIAGNOSIS:  Same  PROCEDURE:  Procedure(s): Biopsy of penile lesion  SURGEON:  Garnett Farm  INDICATION: Mr. Sabino Gasser is a 71 year old male who reports that for several years he had an area of redness beneath his foreskin and involving the glans penis he said it was not tender and did not itch. He may have changed slightly over time he said it hadn't progressed. He was treated with a topical antifungal/steroid preparation without change. He is therefore brought to the operating room for biopsy of the lesion.   ANESTHESIA:  General  EBL:  Minimal  DRAINS:  none  LOCAL MEDICATIONS USED:   7 cc of quarter percent plain Marcaine   SPECIMEN:   biopsy of foreskin and glans  DISPOSITION OF SPECIMEN:  PATHOLOGY  Description of procedure: after informed consent the patient was taken to the major or, placed on the table and administered general LMA anesthesia. His genitalia was then sterilely prepped and draped and an official timeout was then performed.  I performed a dorsal penile block by injecting Marcaine at the base of the penis dorsally in the standard fashion.  His foreskin was retracted and the glans and inner preputial skin was prepped a second time with Betadine. I then made an elliptical incision on the inner surface of the foreskin after it had been retracted. This biopsy site was performed in an area of the dark discoloration of the skin. It was then closed with a running 4-0 chromic suture. Attention was then directed to the glans and on the left lateral aspect near the corona I obtained a small elliptical biopsy through the area of greatest pigmentation. This was also closed with a running 4-0 chromic suture. I then applied Neosporin and the patient was awakened and taken to the recovery room in stable and satisfactory condition. He tolerated the procedure well with no intraoperative complications.    PLAN OF CARE: Discharge to home after PACU  PATIENT DISPOSITION:  PACU - hemodynamically stable.

## 2012-03-21 NOTE — Transfer of Care (Signed)
Immediate Anesthesia Transfer of Care Note  Patient: Steve Wiggins  Procedure(s) Performed: Procedure(s) (LRB): PENILE BIOPSY (N/A)  Patient Location: PACU  Anesthesia Type: General  Level of Consciousness: pt. Sleeping.   Airway & Oxygen Therapy: Patient Spontanous Breathing and Patient connected to face mask oxygen  Post-op Assessment: Report given to PACU RN and Post -op Vital signs reviewed and stable  Post vital signs: Reviewed and stable  Complications: No apparent anesthesia complications

## 2012-03-21 NOTE — Anesthesia Postprocedure Evaluation (Signed)
Anesthesia Post Note  Patient: Steve Wiggins  Procedure(s) Performed: Procedure(s) (LRB): PENILE BIOPSY (N/A)  Anesthesia type: General  Patient location: PACU  Post pain: Pain level controlled  Post assessment: Post-op Vital signs reviewed  Last Vitals:  Filed Vitals:   03/21/12 0930  BP: 151/74  Pulse: 53  Temp: 36.1 C  Resp: 16    Post vital signs: Reviewed  Level of consciousness: sedated  Complications: No apparent anesthesia complications

## 2012-03-21 NOTE — Anesthesia Preprocedure Evaluation (Addendum)
Anesthesia Evaluation  Patient identified by MRN, date of birth, ID band Patient awake    Reviewed: Allergy & Precautions, H&P , NPO status , Patient's Chart, lab work & pertinent test results  History of Anesthesia Complications Negative for: history of anesthetic complications  Airway Mallampati: III TM Distance: >3 FB Neck ROM: Full    Dental  (+) Poor Dentition and Dental Advisory Given,    Pulmonary shortness of breath and with exertion,  breath sounds clear to auscultation  Pulmonary exam normal       Cardiovascular Exercise Tolerance: Poor hypertension, Pt. on medications + CAD and + Cardiac Stents Rhythm:Regular Rate:Normal     Neuro/Psych negative neurological ROS  negative psych ROS   GI/Hepatic Neg liver ROS, GERD-  Medicated,  Endo/Other  Poorly Controlled, Type 2, Oral Hypoglycemic AgentsMorbid obesity  Renal/GU negative Renal ROS  negative genitourinary   Musculoskeletal negative musculoskeletal ROS (+)   Abdominal   Peds negative pediatric ROS (+)  Hematology negative hematology ROS (+)   Anesthesia Other Findings   Reproductive/Obstetrics negative OB ROS                          Anesthesia Physical Anesthesia Plan  ASA: III  Anesthesia Plan: General   Post-op Pain Management:    Induction: Intravenous  Airway Management Planned: LMA  Additional Equipment:   Intra-op Plan:   Post-operative Plan:   Informed Consent: I have reviewed the patients History and Physical, chart, labs and discussed the procedure including the risks, benefits and alternatives for the proposed anesthesia with the patient or authorized representative who has indicated his/her understanding and acceptance.   Dental advisory given  Plan Discussed with:   Anesthesia Plan Comments:         Anesthesia Quick Evaluation

## 2012-03-21 NOTE — Interval H&P Note (Signed)
History and Physical Interval Note:  03/21/2012 7:11 AM  Steve Wiggins  has presented today for surgery, with the diagnosis of RULE OUT PENILE CANCER  The various methods of treatment have been discussed with the patient and family. After consideration of risks, benefits and other options for treatment, the patient has consented to  Procedure(s) (LRB): PENILE BIOPSY (N/A) as a surgical intervention .  The patient's history has been reviewed, patient examined, no change in status, stable for surgery.  I have reviewed the patient's chart and labs.  Questions were answered to the patient's satisfaction.     Garnett Farm

## 2012-03-21 NOTE — Anesthesia Procedure Notes (Signed)
Procedure Name: LMA Insertion Date/Time: 03/21/2012 7:30 AM Performed by: Norva Pavlov Pre-anesthesia Checklist: Patient identified, Emergency Drugs available, Suction available and Patient being monitored Patient Re-evaluated:Patient Re-evaluated prior to inductionOxygen Delivery Method: Circle System Utilized Preoxygenation: Pre-oxygenation with 100% oxygen Intubation Type: IV induction Ventilation: Mask ventilation without difficulty LMA: LMA inserted LMA Size: 4.0 Number of attempts: 1 Airway Equipment and Method: bite block Placement Confirmation: positive ETCO2 Tube secured with: Tape Dental Injury: Teeth and Oropharynx as per pre-operative assessment

## 2012-03-28 ENCOUNTER — Encounter (HOSPITAL_BASED_OUTPATIENT_CLINIC_OR_DEPARTMENT_OTHER): Payer: Self-pay

## 2012-05-30 ENCOUNTER — Emergency Department (HOSPITAL_COMMUNITY)
Admission: EM | Admit: 2012-05-30 | Discharge: 2012-05-30 | Disposition: A | Discharge status: Home / Self Care | Payer: Medicare Other | Attending: Emergency Medicine | Admitting: Emergency Medicine

## 2012-05-30 ENCOUNTER — Emergency Department (HOSPITAL_COMMUNITY): Payer: Medicare Other

## 2012-05-30 DIAGNOSIS — Z79899 Other long term (current) drug therapy: Secondary | ICD-10-CM | POA: Insufficient documentation

## 2012-05-30 DIAGNOSIS — Z87898 Personal history of other specified conditions: Secondary | ICD-10-CM | POA: Insufficient documentation

## 2012-05-30 DIAGNOSIS — R112 Nausea with vomiting, unspecified: Secondary | ICD-10-CM | POA: Insufficient documentation

## 2012-05-30 DIAGNOSIS — I10 Essential (primary) hypertension: Secondary | ICD-10-CM | POA: Insufficient documentation

## 2012-05-30 DIAGNOSIS — R197 Diarrhea, unspecified: Secondary | ICD-10-CM | POA: Insufficient documentation

## 2012-05-30 DIAGNOSIS — E119 Type 2 diabetes mellitus without complications: Secondary | ICD-10-CM | POA: Insufficient documentation

## 2012-05-30 DIAGNOSIS — Z8719 Personal history of other diseases of the digestive system: Secondary | ICD-10-CM | POA: Insufficient documentation

## 2012-05-30 DIAGNOSIS — K8020 Calculus of gallbladder without cholecystitis without obstruction: Secondary | ICD-10-CM | POA: Insufficient documentation

## 2012-05-30 DIAGNOSIS — I2510 Atherosclerotic heart disease of native coronary artery without angina pectoris: Secondary | ICD-10-CM | POA: Insufficient documentation

## 2012-05-30 DIAGNOSIS — Z7982 Long term (current) use of aspirin: Secondary | ICD-10-CM | POA: Insufficient documentation

## 2012-05-30 DIAGNOSIS — K219 Gastro-esophageal reflux disease without esophagitis: Secondary | ICD-10-CM | POA: Insufficient documentation

## 2012-05-30 DIAGNOSIS — Z8639 Personal history of other endocrine, nutritional and metabolic disease: Secondary | ICD-10-CM | POA: Insufficient documentation

## 2012-05-30 DIAGNOSIS — E785 Hyperlipidemia, unspecified: Secondary | ICD-10-CM | POA: Insufficient documentation

## 2012-05-30 DIAGNOSIS — E669 Obesity, unspecified: Secondary | ICD-10-CM | POA: Insufficient documentation

## 2012-05-30 DIAGNOSIS — Z862 Personal history of diseases of the blood and blood-forming organs and certain disorders involving the immune mechanism: Secondary | ICD-10-CM | POA: Insufficient documentation

## 2012-05-30 DIAGNOSIS — Z87828 Personal history of other (healed) physical injury and trauma: Secondary | ICD-10-CM | POA: Insufficient documentation

## 2012-05-30 DIAGNOSIS — Z9861 Coronary angioplasty status: Secondary | ICD-10-CM | POA: Insufficient documentation

## 2012-05-30 LAB — CBC WITH DIFFERENTIAL/PLATELET
Basophils Absolute: 0 10*3/uL (ref 0.0–0.1)
Basophils Relative: 0 % (ref 0–1)
Eosinophils Absolute: 0.1 10*3/uL (ref 0.0–0.7)
Hemoglobin: 13.5 g/dL (ref 13.0–17.0)
MCHC: 35.6 g/dL (ref 30.0–36.0)
Monocytes Relative: 5 % (ref 3–12)
Neutro Abs: 7.3 10*3/uL (ref 1.7–7.7)
Neutrophils Relative %: 79 % — ABNORMAL HIGH (ref 43–77)
Platelets: 163 10*3/uL (ref 150–400)

## 2012-05-30 LAB — COMPREHENSIVE METABOLIC PANEL
ALT: 16 U/L (ref 0–53)
AST: 17 U/L (ref 0–37)
Albumin: 3.7 g/dL (ref 3.5–5.2)
Alkaline Phosphatase: 53 U/L (ref 39–117)
BUN: 17 mg/dL (ref 6–23)
Chloride: 100 mEq/L (ref 96–112)
Potassium: 3.6 mEq/L (ref 3.5–5.1)
Sodium: 137 mEq/L (ref 135–145)
Total Bilirubin: 0.5 mg/dL (ref 0.3–1.2)
Total Protein: 6.8 g/dL (ref 6.0–8.3)

## 2012-05-30 MED ORDER — OXYCODONE-ACETAMINOPHEN 5-325 MG PO TABS: 1.0000 | ORAL_TABLET | Freq: Four times a day (QID) | ORAL | Status: DC | PRN

## 2012-05-30 MED ORDER — ONDANSETRON HCL 4 MG/2ML IJ SOLN
4.0000 mg | Freq: Once | INTRAMUSCULAR | Status: AC
  Administered 2012-05-30: 4 mg via INTRAVENOUS
  Filled 2012-05-30: qty 2

## 2012-05-30 MED ORDER — ONDANSETRON HCL 4 MG/2ML IJ SOLN
INTRAMUSCULAR | Status: AC
  Administered 2012-05-30: 4 mg
  Filled 2012-05-30: qty 2

## 2012-05-30 MED ORDER — ONDANSETRON HCL 4 MG PO TABS: 4.0000 mg | ORAL_TABLET | Freq: Four times a day (QID) | ORAL | Status: DC

## 2012-05-30 MED ORDER — MORPHINE SULFATE 4 MG/ML IJ SOLN
6.0000 mg | Freq: Once | INTRAMUSCULAR | Status: AC
Start: 2012-05-30 — End: 2012-05-30
  Administered 2012-05-30: 6 mg via INTRAVENOUS
  Filled 2012-05-30: qty 2

## 2012-05-30 NOTE — ED Provider Notes (Signed)
Medical screening examination/treatment/procedure(s) were conducted as a shared visit with non-physician practitioner(s) and myself.  I personally evaluated the patient during the encounter Pt c/o ruq pain/epigastric/lower chest, onset at rest this am. Notes similar pain in past but unsure of cause. States is unlike his prior cardiac cp. States was told his gallbladder may be abnormal many years ago, but pt unsure of specifics, unsure if hx gallstones. No cough or uri c/o. No mid to lower abd pain. Nausea. Emesis, clear, not bloody or bilious. No sob. No fever/chills. Labs, cxr, u/s pending. If gi/gb work positive, will discuss w ccs.  If gi workup neg, will plan to consult pts cardiologist.      Suzi Roots, MD 05/30/12 [phone]

## 2012-05-30 NOTE — ED Notes (Signed)
The patient states he woke up this morning at 0200 complaining of right-sided, constant chest pain which he says felt like gas pain, however he did not get any relief when he burped.  The patient took ASA 325 mg, po at his house.  EMS gave nitro-stat 0.4 mg, sL x 1, but he did not get any relief.

## 2012-05-30 NOTE — ED Provider Notes (Signed)
History     CSN: [id number]  Arrival date & time 05/30/12  0556   First MD Initiated Contact with Patient 05/30/12 0558      No chief complaint on file.   (Consider location/radiation/quality/duration/timing/severity/associated sxs/prior treatment) HPI 71 year old male with history of coronary artery disease, diabetes, and hypertension presents complaining of abdominal pain. Patient reports he was awoke this morning with she sharp pain to his right upper quadrant abdomen. Onset is gradual, sharp, persistent, radiates to the mid back, 8/10 in severity, accompanied with nausea, several bouts of nonbloody nonbilious vomitus, and loose stool. Nothing seems to make it better or worse.  Patient was given aspirin and nitroglycerin by EMS without any relief. There are no associated fever, chills, chest pain, shortness of breath, urinary symptoms, hematochezia, melena, or rash. Patient reports having similar symptoms in the past, most recently was last week which lasted for several days but not as severe. Patient reports eating green beans and hot dogs last night.  He still has intact gallbladder.    Pt has cardiac hx with prior stenting.  He underwent coronary stenting in 2011 when he was diagnosed with severe stenosis of an obtuse marginal branch of the left circumflex. He also had a moderate lesion in the diagonal branch and this was treated medically. He denies having chest pain in the past.  He has no prior hx of MI.  Pt is a nonsmoker.     Past Medical History  Diagnosis Date  . Diabetes mellitus     type 2  . Hypertension   . Obesity   . Coronary artery disease CARDIOLOGIST- DR COOPER--  LAST VISIT 03-04-2012 IN [facility]    (03-18-2012 PT DENIES S & S)  . Mild carotid artery disease BILATERAL ICA  0-35%    ASYMPTOMATIC   . Hyperlipidemia   . Dyspnea on exertion   . History of head injury FELL OFF LADDER,  1999--  S/P DRAINAGE SUBDERMAL HEMATOMA--  NO RESIDUAL  . GERD (gastroesophageal  reflux disease)   . Umbilical hernia NO ISSUES  . History of gastric ulcer   . Penile lesion   . History of gout STABLE  . S/P primary angioplasty with coronary stent     Past Surgical History  Procedure Date  . Cardiovascular stress test 08-28-2010  ([place], [place])    EF 66%/ SMALL AREA PROXIMAL POSTEROLATERAL ISCHEMIA  . Coronary angioplasty with stent placement 09-26-2011  ([place], [place])    DRUG-ELUTING STENT X1 TO FIRST OBTUSE MARGINAL BRANCH CIRCUMFLEX/ MODERATE LESION DIAGINAL BRANCH TREATED MEDICALLY  . Surg for drainage subdural  hematoma 1999  . Transthoracic echocardiogram 08-21-2010    LVF NORMAL/ MILD MITRAL AND TRICUSID REGURG.    No family history on file.  History  Substance Use Topics  . Smoking status: Never Smoker   . Smokeless tobacco: Never Used  . Alcohol Use: No      Review of Systems  All other systems reviewed and are negative.    Allergies  Review of patient's allergies indicates no known allergies.  Home Medications   Current Outpatient Rx  Name  Route  Sig  Dispense  Refill  . AMLODIPINE BESYLATE 5 MG PO TABS   Oral   Take 5 mg by mouth every morning.         . ASPIRIN 81 MG PO TABS   Oral   Take 81 mg by mouth every morning.          Marland Kitchen CARVEDILOL 25 MG PO  TABS   Oral   Take 1 tablet (25 mg total) by mouth 2 (two) times daily.   60 tablet   11   . GLIMEPIRIDE 4 MG PO TABS   Oral   Take 4 mg by mouth daily before breakfast.          . HYDROCHLOROTHIAZIDE 25 MG PO TABS   Oral   Take 25 mg by mouth every morning.         Marland Kitchen LISINOPRIL 20 MG PO TABS   Oral   Take 40 mg by mouth every morning.          Marland Kitchen METFORMIN HCL ER 500 MG PO TB24   Oral   Take 500 mg by mouth 3 (three) times daily.          Marland Kitchen OMEPRAZOLE 40 MG PO CPDR   Oral   Take 40 mg by mouth every evening.          Marland Kitchen SIMVASTATIN 20 MG PO TABS   Oral   Take 20 mg by mouth every evening.         Marland Kitchen TAMSULOSIN HCL 0.4 MG PO CAPS   Oral   Take 1  tablet by mouth daily after breakfast.          . VITAMIN C 500 MG PO TABS   Oral   Take 500 mg by mouth every evening.            There were no vitals taken for this visit.  Physical Exam  Nursing note and vitals reviewed. Constitutional: He is oriented to person, place, and time. He appears well-developed and well-nourished. No distress.       Awake, alert, nontoxic appearance.  Moderately obese  HENT:  Head: Atraumatic.  Mouth/Throat: Oropharynx is clear and moist.  Eyes: Conjunctivae normal are normal. Right eye exhibits no discharge. Left eye exhibits no discharge.  Neck: Normal range of motion. Neck supple.  Cardiovascular: Normal rate and regular rhythm.  Exam reveals no gallop and no friction rub.   No murmur heard. Pulmonary/Chest: Effort normal. No respiratory distress. He has no wheezes. He has no rales. He exhibits no tenderness.  Abdominal: Soft. Bowel sounds are normal. There is no tenderness (right upper quadrant tenderness on palpation, negative Murphy's sign. No guarding, no rebound tenderness.). There is no rebound and no guarding.  Musculoskeletal: He exhibits no edema and no tenderness.       ROM appears intact, no obvious focal weakness  Neurological: He is alert and oriented to person, place, and time.  Skin: Skin is warm and dry. No rash noted. He is not diaphoretic.  Psychiatric: He has a normal mood and affect.    ED Course  Procedures (including critical care time)  Labs Reviewed - No data to display No results found.   No diagnosis found.  ECG reviewed and interpreted by me  Date: 05/30/2012  Rate: 49  Rhythm: normal sinus rhythm  QRS Axis: normal  Intervals: normal  ST/T Wave abnormalities: normal  Conduction Disutrbances: none  Narrative Interpretation:   Old EKG Reviewed: No significant changes noted  Results for orders placed during the hospital encounter of 05/30/12  CBC WITH DIFFERENTIAL      Component Value Range   WBC 9.3   4.0 - 10.5 K/uL   RBC 4.28  4.22 - 5.81 MIL/uL   Hemoglobin 13.5  13.0 - 17.0 g/dL   HCT 16.1 (*) 09.6 - 04.5 %   MCV 88.6  78.0 - 100.0 fL   MCH 31.5  26.0 - 34.0 pg   MCHC 35.6  30.0 - 36.0 g/dL   RDW 78.2  95.6 - 21.3 %   Platelets 163  150 - 400 K/uL   Neutrophils Relative 79 (*) 43 - 77 %   Neutro Abs 7.3  1.7 - 7.7 K/uL   Lymphocytes Relative 15  12 - 46 %   Lymphs Abs 1.4  0.7 - 4.0 K/uL   Monocytes Relative 5  3 - 12 %   Monocytes Absolute 0.5  0.1 - 1.0 K/uL   Eosinophils Relative 1  0 - 5 %   Eosinophils Absolute 0.1  0.0 - 0.7 K/uL   Basophils Relative 0  0 - 1 %   Basophils Absolute 0.0  0.0 - 0.1 K/uL  COMPREHENSIVE METABOLIC PANEL      Component Value Range   Sodium 137  135 - 145 mEq/L   Potassium 3.6  3.5 - 5.1 mEq/L   Chloride 100  96 - 112 mEq/L   CO2 24  19 - 32 mEq/L   Glucose, Bld 278 (*) 70 - 99 mg/dL   BUN 17  6 - 23 mg/dL   Creatinine, Ser 0.86  0.50 - 1.35 mg/dL   Calcium 9.4  8.4 - 57.8 mg/dL   Total Protein 6.8  6.0 - 8.3 g/dL   Albumin 3.7  3.5 - 5.2 g/dL   AST 17  0 - 37 U/L   ALT 16  0 - 53 U/L   Alkaline Phosphatase 53  39 - 117 U/L   Total Bilirubin 0.5  0.3 - 1.2 mg/dL   GFR calc non Af Amer 85 (*) >90 mL/min   GFR calc Af Amer >90  >90 mL/min  LIPASE, BLOOD      Component Value Range   Lipase 17  11 - 59 U/L  TROPONIN I      Component Value Range   Troponin I <0.30  <0.30 ng/mL   Dg Chest 2 View  05/30/2012  *RADIOLOGY REPORT*  Clinical Data: Right lower chest pain.  CHEST - 2 VIEW  Comparison: None available.  Findings: The heart size is normal.  The lungs are clear.  There is partial eventration of the anterior right hemidiaphragm, likely chronic finding.  Mild degenerative changes are noted in the thoracic spine.  IMPRESSION:  1.  No acute cardiopulmonary disease.   Original Report Authenticated By: Marin Roberts, M.D.    US Abdomen Complete  05/30/2012  *RADIOLOGY REPORT*  Clinical Data:  Right upper quadrant pain.  COMPLETE  ABDOMINAL ULTRASOUND  Comparison:  None.  Findings:  Gallbladder:  Multiple small mobile gallstones within the gallbladder.  No wall thickening.  Negative sonographic Murphy's.  Common bile duct:   Normal caliber, 5 mm.  Liver:  No focal lesion identified.  Within normal limits in parenchymal echogenicity.  IVC:  Appears normal.  Pancreas:  No focal abnormality seen.  Spleen:  Within normal limits in size and echotexture.  Right Kidney:   Slight fullness of the renal collecting system. Normal echotexture.  No focal abnormality.  Left Kidney:  1.8 cm cyst in the lower pole which appears benign. Slight fullness of the collecting system.  Normal echotexture.  Abdominal aorta:  Limited visualization.  No aneurysm.  Normal ureteral jets are noted within the bladder.  IMPRESSION: Cholelithiasis.  No sonographic evidence of acute cholecystitis.  Slight fullness of the renal collecting systems bilaterally without frank hydronephrosis.  Original Report Authenticated By: Charlett Nose, M.D.     1. cholelithiasis   MDM  Patient presents with a right upper quadrant abdominal pain, not chest pain. This symptom is suggestive of biliary colic. He does have history of coronary disease, however EKG is unremarkable, and his symptoms does not relief with aspirin or nitroglycerin. Workup initiated.   8:31 AM Abdominal ultrasound reveals multiple small mobile gallstones within the gallbladder with no evidence of wall thickening to suggest cholecystitis.  Patient has elevated blood sugar of 278. Otherwise his labs are unremarkable. Normal white count.  He has normal EKG, negative troponin, Unremarkable chest x-ray, and no active chest pain.   9:05 AM Patient is currently pain-free. Able to tolerates by mouth. I recommend for him to followup with [facility] for elective cholecystectomy as needed.  Strict return precaution discussed.  Recommend avoid all fatty food in the mean time.  Pt voice understanding and  agrees with plan.    BP 164/70  Pulse 47  Temp 97.9 F (36.6 C) (Oral)  Resp 17  Ht 5\' 7"  (1.702 m)  Wt 229 lb (103.874 kg)  BMI 35.87 kg/m2  SpO2 100%  I have reviewed nursing notes and vital signs. I personally reviewed the imaging tests through PACS system  I reviewed available ER/hospitalization records thought the EMR   Fayrene Helper, [place] 05/30/12 [id number]

## 2012-05-30 NOTE — ED Notes (Signed)
PO given. Will monitor for toleration

## 2012-05-31 NOTE — ED Provider Notes (Signed)
Medical screening examination/treatment/procedure(s) were conducted as a shared visit with non-physician practitioner(s) and myself.  I personally evaluated the patient during the encounter See note  Suzi Roots, MD 05/31/12 [phone]

## 2012-06-02 ENCOUNTER — Encounter (INDEPENDENT_AMBULATORY_CARE_PROVIDER_SITE_OTHER): Payer: Self-pay | Admitting: General Surgery

## 2012-06-02 ENCOUNTER — Ambulatory Visit (INDEPENDENT_AMBULATORY_CARE_PROVIDER_SITE_OTHER): Payer: Medicare Other | Admitting: General Surgery

## 2012-06-02 VITALS — BP 138/80 | HR 60 | Temp 97.0°F | Resp 18 | Ht 67.0 in | Wt 224.0 lb

## 2012-06-02 DIAGNOSIS — K8020 Calculus of gallbladder without cholecystitis without obstruction: Secondary | ICD-10-CM

## 2012-06-02 DIAGNOSIS — K810 Acute cholecystitis: Secondary | ICD-10-CM | POA: Insufficient documentation

## 2012-06-02 NOTE — Patient Instructions (Addendum)
Plan for laparoscopic cholecystectomy with intraoperative cholangiogram Will need cardiac clearance prior to scheduling

## 2012-06-02 NOTE — Progress Notes (Signed)
Subjective:     Patient ID: Steve Wiggins, male   DOB: Nov 01, 1940, 71 y.o.   MRN: [id number]  HPI We're asked to see the patient in consultation by Dr. Rubin Payor to evaluate him for gallstones. The patient is a 71 year old white male who recently had an episode of right upper quadrant pain. The pain was severe and took him to the emergency department. The pain was associated with nausea and vomiting. As part of his workup he underwent an ultrasound which did show stones in the gallbladder but no gallbladder wall thickening or ductal dilatation. He believes he had a pain similar to this about 3 years ago in [place]. He does have a history of a cardiac stent being placed in 2011 but is only on aspirin.  Review of Systems  Constitutional: Negative.   HENT: Negative.   Eyes: Negative.   Respiratory: Negative.   Cardiovascular: Negative.   Gastrointestinal: Negative.   Genitourinary: Negative.   Musculoskeletal: Negative.   Skin: Negative.   Neurological: Negative.   Hematological: Negative.   Psychiatric/Behavioral: Negative.        Objective:   Physical Exam  Constitutional: He is oriented to person, place, and time. He appears well-developed and well-nourished.  HENT:  Head: Normocephalic and atraumatic.  Eyes: Conjunctivae normal and EOM are normal. Pupils are equal, round, and reactive to light.  Neck: Normal range of motion. Neck supple.  Cardiovascular: Normal rate, regular rhythm and normal heart sounds.   Pulmonary/Chest: Effort normal and breath sounds normal.  Abdominal: Soft. Bowel sounds are normal. He exhibits no mass. There is no tenderness.  Musculoskeletal: Normal range of motion.  Neurological: He is alert and oriented to person, place, and time.  Skin: Skin is warm and dry.  Psychiatric: He has a normal mood and affect. His behavior is normal.       Assessment:     The patient has what sounds like symptomatic cholelithiasis. Because of the risk of  further painful episodes and possible pancreatitis I think he would benefit from having his gallbladder removed. I've discussed with him in detail the risks and benefits of the operation to remove the gallbladder as well as some of the technical aspects and he understands and wishes to proceed. We will need cardiac clearance prior to scheduling his surgery.    Plan:     Plan for cardiac clearance done laparoscopic cholecystectomy with intraoperative cholangiogram if cleared

## 2012-06-03 ENCOUNTER — Inpatient Hospital Stay (HOSPITAL_COMMUNITY)
Admission: EM | Admit: 2012-06-03 | Discharge: 2012-06-09 | DRG: 418 | Disposition: A | Discharge status: Home / Self Care | Payer: Medicare Other | Attending: Internal Medicine | Admitting: Internal Medicine

## 2012-06-03 ENCOUNTER — Encounter (HOSPITAL_COMMUNITY): Payer: Self-pay | Admitting: *Deleted

## 2012-06-03 ENCOUNTER — Emergency Department (HOSPITAL_COMMUNITY): Payer: Medicare Other

## 2012-06-03 DIAGNOSIS — Z7982 Long term (current) use of aspirin: Secondary | ICD-10-CM

## 2012-06-03 DIAGNOSIS — K8000 Calculus of gallbladder with acute cholecystitis without obstruction: Principal | ICD-10-CM | POA: Diagnosis present

## 2012-06-03 DIAGNOSIS — K429 Umbilical hernia without obstruction or gangrene: Secondary | ICD-10-CM | POA: Diagnosis present

## 2012-06-03 DIAGNOSIS — Z23 Encounter for immunization: Secondary | ICD-10-CM

## 2012-06-03 DIAGNOSIS — Z79899 Other long term (current) drug therapy: Secondary | ICD-10-CM

## 2012-06-03 DIAGNOSIS — I2510 Atherosclerotic heart disease of native coronary artery without angina pectoris: Secondary | ICD-10-CM

## 2012-06-03 DIAGNOSIS — D72829 Elevated white blood cell count, unspecified: Secondary | ICD-10-CM | POA: Diagnosis present

## 2012-06-03 DIAGNOSIS — E119 Type 2 diabetes mellitus without complications: Secondary | ICD-10-CM | POA: Diagnosis present

## 2012-06-03 DIAGNOSIS — I10 Essential (primary) hypertension: Secondary | ICD-10-CM

## 2012-06-03 DIAGNOSIS — Z87820 Personal history of traumatic brain injury: Secondary | ICD-10-CM

## 2012-06-03 DIAGNOSIS — E785 Hyperlipidemia, unspecified: Secondary | ICD-10-CM

## 2012-06-03 DIAGNOSIS — Z87440 Personal history of urinary (tract) infections: Secondary | ICD-10-CM

## 2012-06-03 DIAGNOSIS — Z9861 Coronary angioplasty status: Secondary | ICD-10-CM

## 2012-06-03 DIAGNOSIS — M109 Gout, unspecified: Secondary | ICD-10-CM | POA: Diagnosis present

## 2012-06-03 DIAGNOSIS — K8020 Calculus of gallbladder without cholecystitis without obstruction: Secondary | ICD-10-CM

## 2012-06-03 DIAGNOSIS — N390 Urinary tract infection, site not specified: Secondary | ICD-10-CM

## 2012-06-03 DIAGNOSIS — K219 Gastro-esophageal reflux disease without esophagitis: Secondary | ICD-10-CM | POA: Diagnosis present

## 2012-06-03 DIAGNOSIS — E669 Obesity, unspecified: Secondary | ICD-10-CM | POA: Diagnosis present

## 2012-06-03 DIAGNOSIS — K810 Acute cholecystitis: Secondary | ICD-10-CM

## 2012-06-03 LAB — CBC WITH DIFFERENTIAL/PLATELET
Basophils Absolute: 0 10*3/uL (ref 0.0–0.1)
Eosinophils Relative: 2 % (ref 0–5)
HCT: 38.8 % — ABNORMAL LOW (ref 39.0–52.0)
Hemoglobin: 13.7 g/dL (ref 13.0–17.0)
Lymphocytes Relative: 17 % (ref 12–46)
Lymphs Abs: 2.1 10*3/uL (ref 0.7–4.0)
MCV: 88 fL (ref 78.0–100.0)
Monocytes Absolute: 0.9 10*3/uL (ref 0.1–1.0)
Neutro Abs: 9.1 10*3/uL — ABNORMAL HIGH (ref 1.7–7.7)
RBC: 4.41 MIL/uL (ref 4.22–5.81)
RDW: 12.8 % (ref 11.5–15.5)
WBC: 12.3 10*3/uL — ABNORMAL HIGH (ref 4.0–10.5)

## 2012-06-03 LAB — POCT I-STAT TROPONIN I: Troponin i, poc: 0 ng/mL (ref 0.00–0.08)

## 2012-06-03 MED ORDER — ONDANSETRON HCL 4 MG/2ML IJ SOLN
4.0000 mg | Freq: Once | INTRAMUSCULAR | Status: AC
  Administered 2012-06-03: 4 mg via INTRAVENOUS
  Filled 2012-06-03: qty 2

## 2012-06-03 MED ORDER — FENTANYL CITRATE 0.05 MG/ML IJ SOLN
50.0000 ug | Freq: Once | INTRAMUSCULAR | Status: AC
  Administered 2012-06-03: 50 ug via INTRAVENOUS
  Filled 2012-06-03: qty 2

## 2012-06-03 MED ORDER — SODIUM CHLORIDE 0.9 % IV SOLN
Freq: Once | INTRAVENOUS | Status: AC
  Administered 2012-06-03: 10 mL/h via INTRAVENOUS

## 2012-06-03 MED ORDER — FENTANYL CITRATE 0.05 MG/ML IJ SOLN
50.0000 ug | Freq: Once | INTRAMUSCULAR | Status: AC
  Administered 2012-06-03: 50 ug via INTRAVENOUS

## 2012-06-03 NOTE — ED Notes (Signed)
Alert, NAD, calm, interactive, skin W&D, resps e/u, speaking in clear complete sentences, looks uncomfortable, rates pain 10/10, c/o RUQ pain and nausea.  Nausea improved/lessened after zofran given, pain unchanged at this time, pending EDP eval, abd [place] and lab results, resulted labs reviewed. Family x2 at [facility].

## 2012-06-03 NOTE — ED Notes (Signed)
Patient here with gallstones issues and having problems urinating.  Patient also has been N/V denies any blood in urine or emesis.  RUQ pain.

## 2012-06-03 NOTE — ED Notes (Signed)
Patient took his oxycodone and nausea medicine around 8pm w/o relief.

## 2012-06-04 ENCOUNTER — Encounter (HOSPITAL_COMMUNITY): Payer: Self-pay | Admitting: Internal Medicine

## 2012-06-04 ENCOUNTER — Emergency Department (HOSPITAL_COMMUNITY): Payer: Medicare Other

## 2012-06-04 DIAGNOSIS — I2510 Atherosclerotic heart disease of native coronary artery without angina pectoris: Secondary | ICD-10-CM

## 2012-06-04 DIAGNOSIS — E785 Hyperlipidemia, unspecified: Secondary | ICD-10-CM

## 2012-06-04 DIAGNOSIS — K8020 Calculus of gallbladder without cholecystitis without obstruction: Secondary | ICD-10-CM

## 2012-06-04 DIAGNOSIS — I10 Essential (primary) hypertension: Secondary | ICD-10-CM

## 2012-06-04 DIAGNOSIS — K8010 Calculus of gallbladder with chronic cholecystitis without obstruction: Secondary | ICD-10-CM

## 2012-06-04 LAB — COMPREHENSIVE METABOLIC PANEL
ALT: 20 U/L (ref 0–53)
AST: 21 U/L (ref 0–37)
CO2: 23 mEq/L (ref 19–32)
Calcium: 9.8 mg/dL (ref 8.4–10.5)
Chloride: 97 mEq/L (ref 96–112)
Creatinine, Ser: 1.05 mg/dL (ref 0.50–1.35)
GFR calc Af Amer: 82 mL/min — ABNORMAL LOW (ref >90)
GFR calc non Af Amer: 71 mL/min — ABNORMAL LOW (ref >90)
Glucose, Bld: 167 mg/dL — ABNORMAL HIGH (ref 70–99)
Total Bilirubin: 0.5 mg/dL (ref 0.3–1.2)

## 2012-06-04 LAB — GLUCOSE, CAPILLARY
Glucose-Capillary: 154 mg/dL — ABNORMAL HIGH (ref 70–99)
Glucose-Capillary: 157 mg/dL — ABNORMAL HIGH (ref 70–99)
Glucose-Capillary: 136 mg/dL — ABNORMAL HIGH (ref 70–99)

## 2012-06-04 MED ORDER — LISINOPRIL 40 MG PO TABS
40.0000 mg | ORAL_TABLET | Freq: Every morning | ORAL | Status: DC
  Administered 2012-06-04: 40 mg via ORAL
  Filled 2012-06-04 (×2): qty 1

## 2012-06-04 MED ORDER — ASPIRIN 81 MG PO TABS: 81.0000 mg | ORAL_TABLET | Freq: Every morning | ORAL | Status: DC

## 2012-06-04 MED ORDER — CHLORHEXIDINE GLUCONATE 4 % EX LIQD
1.0000 "application " | Freq: Once | CUTANEOUS | Status: AC
  Administered 2012-06-04: 1 via TOPICAL
  Filled 2012-06-04: qty 15

## 2012-06-04 MED ORDER — ONDANSETRON HCL 4 MG PO TABS: 4.0000 mg | ORAL_TABLET | Freq: Four times a day (QID) | ORAL | Status: DC | PRN

## 2012-06-04 MED ORDER — PANTOPRAZOLE SODIUM 40 MG PO TBEC
80.0000 mg | DELAYED_RELEASE_TABLET | Freq: Every day | ORAL | Status: DC
  Administered 2012-06-04 – 2012-06-09 (×6): 80 mg via ORAL
  Filled 2012-06-04 (×3): qty 2
  Filled 2012-06-04: qty 1
  Filled 2012-06-04 (×3): qty 2

## 2012-06-04 MED ORDER — HYDROMORPHONE HCL PF 1 MG/ML IJ SOLN
1.0000 mg | Freq: Once | INTRAMUSCULAR | Status: AC
  Administered 2012-06-04: 1 mg via INTRAVENOUS
  Filled 2012-06-04: qty 1

## 2012-06-04 MED ORDER — ALLOPURINOL 100 MG PO TABS
100.0000 mg | ORAL_TABLET | Freq: Every day | ORAL | Status: DC
  Administered 2012-06-04 – 2012-06-09 (×6): 100 mg via ORAL
  Filled 2012-06-04 (×6): qty 1

## 2012-06-04 MED ORDER — AMLODIPINE BESYLATE 5 MG PO TABS
5.0000 mg | ORAL_TABLET | Freq: Every morning | ORAL | Status: DC
  Administered 2012-06-04 – 2012-06-09 (×6): 5 mg via ORAL
  Filled 2012-06-04 (×6): qty 1

## 2012-06-04 MED ORDER — FENOFIBRATE 160 MG PO TABS
160.0000 mg | ORAL_TABLET | Freq: Every day | ORAL | Status: DC
  Administered 2012-06-04 – 2012-06-09 (×6): 160 mg via ORAL
  Filled 2012-06-04 (×6): qty 1

## 2012-06-04 MED ORDER — SIMVASTATIN 20 MG PO TABS
20.0000 mg | ORAL_TABLET | Freq: Every evening | ORAL | Status: DC
  Administered 2012-06-04 – 2012-06-08 (×5): 20 mg via ORAL
  Filled 2012-06-04 (×6): qty 1

## 2012-06-04 MED ORDER — SODIUM CHLORIDE 0.9 % IV SOLN
INTRAVENOUS | Status: DC
  Administered 2012-06-04: 125 mL via INTRAVENOUS
  Administered 2012-06-04 – 2012-06-05 (×4): via INTRAVENOUS

## 2012-06-04 MED ORDER — CEFAZOLIN SODIUM-DEXTROSE 2-3 GM-% IV SOLR
2.0000 g | INTRAVENOUS | Status: DC
  Filled 2012-06-04: qty 50

## 2012-06-04 MED ORDER — ASPIRIN 81 MG PO CHEW
81.0000 mg | CHEWABLE_TABLET | Freq: Every day | ORAL | Status: DC
  Administered 2012-06-04 – 2012-06-09 (×6): 81 mg via ORAL
  Filled 2012-06-04 (×7): qty 1

## 2012-06-04 MED ORDER — PNEUMOCOCCAL VAC POLYVALENT 25 MCG/0.5ML IJ INJ
0.5000 mL | INJECTION | INTRAMUSCULAR | Status: AC
  Administered 2012-06-05: 0.5 mL via INTRAMUSCULAR
  Filled 2012-06-04: qty 0.5

## 2012-06-04 MED ORDER — CARVEDILOL 25 MG PO TABS
25.0000 mg | ORAL_TABLET | Freq: Two times a day (BID) | ORAL | Status: DC
  Administered 2012-06-04 – 2012-06-09 (×11): 25 mg via ORAL
  Filled 2012-06-04 (×14): qty 1

## 2012-06-04 MED ORDER — HYDROMORPHONE HCL PF 1 MG/ML IJ SOLN
1.0000 mg | INTRAMUSCULAR | Status: AC
  Administered 2012-06-04: 1 mg via INTRAVENOUS
  Filled 2012-06-04: qty 1

## 2012-06-04 MED ORDER — DIPHENHYDRAMINE-APAP (SLEEP) 25-500 MG PO TABS: 1.0000 | ORAL_TABLET | Freq: Every evening | ORAL | Status: DC | PRN

## 2012-06-04 MED ORDER — INSULIN ASPART 100 UNIT/ML ~~LOC~~ SOLN
0.0000 [IU] | SUBCUTANEOUS | Status: DC
  Administered 2012-06-04 (×2): 2 [IU] via SUBCUTANEOUS
  Administered 2012-06-04: 3 [IU] via SUBCUTANEOUS
  Administered 2012-06-04: 2 [IU] via SUBCUTANEOUS
  Administered 2012-06-04: 3 [IU] via SUBCUTANEOUS
  Administered 2012-06-05: 2 [IU] via SUBCUTANEOUS
  Administered 2012-06-05 (×3): 3 [IU] via SUBCUTANEOUS
  Administered 2012-06-06 (×2): 2 [IU] via SUBCUTANEOUS
  Administered 2012-06-06: 3 [IU] via SUBCUTANEOUS
  Administered 2012-06-06: 5 [IU] via SUBCUTANEOUS
  Administered 2012-06-06: 2 [IU] via SUBCUTANEOUS
  Administered 2012-06-06: 5 [IU] via SUBCUTANEOUS
  Administered 2012-06-07: 3 [IU] via SUBCUTANEOUS
  Administered 2012-06-07 (×2): 2 [IU] via SUBCUTANEOUS
  Administered 2012-06-07: 3 [IU] via SUBCUTANEOUS
  Administered 2012-06-07: 2 [IU] via SUBCUTANEOUS
  Administered 2012-06-07: 3 [IU] via SUBCUTANEOUS
  Administered 2012-06-08 (×3): 2 [IU] via SUBCUTANEOUS
  Administered 2012-06-08 (×2): 3 [IU] via SUBCUTANEOUS
  Administered 2012-06-08: 5 [IU] via SUBCUTANEOUS
  Administered 2012-06-09: 3 [IU] via SUBCUTANEOUS
  Administered 2012-06-09 (×3): 2 [IU] via SUBCUTANEOUS

## 2012-06-04 MED ORDER — DEXTROSE-NACL 5-0.9 % IV SOLN
INTRAVENOUS | Status: DC
  Administered 2012-06-04: 05:00:00 via INTRAVENOUS

## 2012-06-04 MED ORDER — ONDANSETRON HCL 4 MG/2ML IJ SOLN
4.0000 mg | Freq: Once | INTRAMUSCULAR | Status: AC
  Administered 2012-06-04: 4 mg via INTRAVENOUS

## 2012-06-04 MED ORDER — CHLORHEXIDINE GLUCONATE 4 % EX LIQD
1.0000 "application " | Freq: Once | CUTANEOUS | Status: DC
  Filled 2012-06-04: qty 15

## 2012-06-04 MED ORDER — ONDANSETRON HCL 4 MG/2ML IJ SOLN
4.0000 mg | Freq: Four times a day (QID) | INTRAMUSCULAR | Status: DC | PRN
  Administered 2012-06-04 (×2): 4 mg via INTRAVENOUS
  Filled 2012-06-04 (×2): qty 2

## 2012-06-04 MED ORDER — ZOLPIDEM TARTRATE 5 MG PO TABS
5.0000 mg | ORAL_TABLET | Freq: Every evening | ORAL | Status: DC | PRN
  Administered 2012-06-08: 5 mg via ORAL
  Filled 2012-06-04: qty 1

## 2012-06-04 MED ORDER — ONDANSETRON HCL 4 MG/2ML IJ SOLN
INTRAMUSCULAR | Status: AC
  Filled 2012-06-04: qty 2

## 2012-06-04 MED ORDER — HYDROMORPHONE HCL PF 1 MG/ML IJ SOLN
1.0000 mg | INTRAMUSCULAR | Status: DC | PRN
  Administered 2012-06-04 – 2012-06-05 (×6): 2 mg via INTRAVENOUS
  Administered 2012-06-05 – 2012-06-06 (×3): 1 mg via INTRAVENOUS
  Administered 2012-06-06 – 2012-06-07 (×4): 2 mg via INTRAVENOUS
  Administered 2012-06-07: 1 mg via INTRAVENOUS
  Administered 2012-06-07 – 2012-06-08 (×2): 2 mg via INTRAVENOUS
  Filled 2012-06-04: qty 2
  Filled 2012-06-04: qty 1
  Filled 2012-06-04 (×2): qty 2
  Filled 2012-06-04 (×2): qty 1
  Filled 2012-06-04 (×3): qty 2
  Filled 2012-06-04: qty 1
  Filled 2012-06-04: qty 2
  Filled 2012-06-04: qty 1
  Filled 2012-06-04 (×5): qty 2
  Filled 2012-06-04: qty 1

## 2012-06-04 MED ORDER — HEPARIN SODIUM (PORCINE) 5000 UNIT/ML IJ SOLN
5000.0000 [IU] | Freq: Three times a day (TID) | INTRAMUSCULAR | Status: DC
  Administered 2012-06-04 – 2012-06-09 (×16): 5000 [IU] via SUBCUTANEOUS
  Filled 2012-06-04 (×19): qty 1

## 2012-06-04 NOTE — ED Notes (Signed)
EDP finished at [facility], orders received and initiated, meds given, pCXR done, pt to [place], family x2 present in room.

## 2012-06-04 NOTE — Progress Notes (Signed)
   Patient seen earlier today by my colleague Dr. Conley Rolls.  Patient seen and examined, and data base reviewed.  RUQ abdominal pain, per recent interview with gen.surgery he is for laparoscopic cholecystectomy.  Gen. surgery were waiting for cardiac clearance.  As outlined on the admission note per Dr. Conley Rolls patient can proceed to surgery without intervention.  Has history of CAD, no recent symptoms or chest pain. I don't think invasive testing we'll change any outcome.  I have asked Mrs. Doristine Mango PA, to take over the care of the patient, I have very little or nothing to add to this gentleman care.  Clint Lipps Pager: [phone] 06/04/2012, 1:17 PM

## 2012-06-04 NOTE — Consult Note (Signed)
CARDIOLOGY CONSULT NOTE  Patient ID: Steve Wiggins MRN: [id number], DOB/AGE: [birth year]   Admit date: 06/03/2012 Date of Consult: 06/04/2012  Primary Physician: [name], MD Primary Cardiologist: Judie Petit. Excell Seltzer, MD  Pt. Profile  71 year old male with a history of PCI to OM in 08/2010 admitted today for Lap chole tomorrow.   Problem List  Past Medical History  Diagnosis Date  . Diabetes mellitus     type 2  . Hypertension   . Obesity   . Coronary artery disease     a. 07/2010 echo: nl EF;  b. 08/2010 Cath/PCI: Resolute DES to OM  . Mild carotid artery disease     a. 07/2010 Carotid U/S: bilat ICA 0-35%   . Hyperlipidemia   . History of head injury FELL OFF LADDER,  1999--  S/P DRAINAGE SUBDERMAL HEMATOMA--  NO RESIDUAL  . GERD (gastroesophageal reflux disease)   . Umbilical hernia   . History of gastric ulcer   . Penile lesion   . History of gout     Past Surgical History  Procedure Date  . Cardiovascular stress test 08-28-2010  ([place], [place])    EF 66%/ SMALL AREA PROXIMAL POSTEROLATERAL ISCHEMIA  . Coronary angioplasty with stent placement 09-26-2011  ([place], [place])    DRUG-ELUTING STENT X1 TO FIRST OBTUSE MARGINAL BRANCH CIRCUMFLEX/ MODERATE LESION DIAGINAL BRANCH TREATED MEDICALLY  . Surg for drainage subdural  hematoma 1999  . Transthoracic echocardiogram 08-21-2010    LVF NORMAL/ MILD MITRAL AND TRICUSID REGURG.    Allergies  No Known Allergies  HPI   71 year old male with a positive history of CAD. He had a stress test in 07/2010 for dyspnea on exertion (states he never had chest pain) which was abnormal. It was followed by a diagnostic cath (08/2010) which resulted in PCI to OM. He also had moderately tight stenosis of a diagonal branch, for which medical management was recommended. He did not notice any significant change in his breathing after the procedure. He denies any complaints of chest pain or associated s/s since this time. His breathing has not  improved significantly, but it has also not gotten any worse.  He was able to push mow his yard last week without any significant DOE. He has recently been in his usual state of health until 11/8 when he presented to the ED with RUQ pain and was diagnosed with cholelithiasis and arrangements were being made for lap chole.  Early this morning he had increased RUQ pain, presented to the ED, and was admitted for symptomatic cholelithiasis. We are being consulted for pre-op clearance. Currently the patient denies chest pain. He has some baseline SOB that has not been exacerbated, and nausea which he attributes to his RUQ pain.   Inpatient Medications   . allopurinol  100 mg Oral Daily  . amLODipine  5 mg Oral q morning - 10a  . aspirin  81 mg Oral Daily  . carvedilol  25 mg Oral BID WC  . fenofibrate  160 mg Oral Daily  . heparin  5,000 Units Subcutaneous Q8H  . insulin aspart  0-15 Units Subcutaneous Q4H  . lisinopril  40 mg Oral q morning - 10a  . pantoprazole  80 mg Oral Daily  . pneumococcal 23 valent vaccine  0.5 mL Intramuscular Tomorrow-1000  . simvastatin  20 mg Oral QPM    Family History History reviewed. No pertinent family history.   Social History History   Social History  . Marital  Status: Married    Spouse Name: N/A    Number of Children: N/A  . Years of Education: N/A   Occupational History  . Not on file.   Social History Main Topics  . Smoking status: Former Games developer  . Smokeless tobacco: Never Used     Comment: quit over 50 yrs ago.  . Alcohol Use: No     Comment: previously had an occasional drink but nothing in 50 yrs.  . Drug Use: No  . Sexually Active: Not on file   Other Topics Concern  . Not on file   Social History Narrative   Lives in [place] with his wife.    Review of Systems  General:  No chills, fever, night sweats or weight changes.  Cardiovascular:  ++ dyspnea on exertion. No chest pain, edema, orthopnea, palpitations, paroxysmal nocturnal  dyspnea. Dermatological: No rash, lesions/masses Respiratory: No cough, dyspnea Urologic: No hematuria, dysuria Abdominal:   ++ nausea and ruq tenderness.  No vomiting, diarrhea, bright red blood per rectum, melena, or hematemesis Neurologic:  No visual changes, wkns, changes in mental status. All other systems reviewed and are otherwise negative except as noted above.  Physical Exam  Blood pressure 174/78, pulse 73, temperature 97.7 F (36.5 C), temperature source Oral, resp. rate 20, height 5\' 7"  (1.702 m), weight 221 lb 9 oz (100.5 kg), SpO2 95.00%.  General: Pleasant, c/o mild-moderate pain Psych: Normal affect. Neuro: Alert and oriented X 3. Moves all extremities spontaneously. HEENT: Normal  Neck: Supple without bruits or JVD. Lungs:  Resp regular and unlabored, CTA. Heart: RRR no s3, s4, or murmurs. Abdomen: Soft, protuberant, RUQ tenderness, non-distended, BS + x 4.  Extremities: No clubbing, cyanosis or edema. DP/PT/Radials 2+ and equal bilaterally.  Labs  Lab Results  Component Value Date   WBC 12.3* 06/03/2012   HGB 13.7 06/03/2012   HCT 38.8* 06/03/2012   MCV 88.0 06/03/2012   PLT 189 06/03/2012     Lab 06/03/12 2318  NA 135  K 4.1  CL 97  CO2 23  BUN 23  CREATININE 1.05  CALCIUM 9.8  PROT 6.9  BILITOT 0.5  ALKPHOS 47  ALT 20  AST 21  GLUCOSE 167*   Radiology/Studies  US Abdomen Limited  06/04/2012  *RADIOLOGY REPORT*  Clinical Data:  Abdominal pain, nausea, vomiting.  LIMITED ABDOMINAL ULTRASOUND - RIGHT UPPER QUADRANT  Comparison:  05/30/2012  Findings:  Gallbladder:  Multiple small mobile gallstones as well as sludge within the gallbladder.  No gallbladder wall thickening.  Negative sonographic Murphy's.  Appearance similar to recent study.  Common bile duct:  Normal caliber, 6 mm.  Liver:  Heterogeneous, increased echotexture, more appreciable on today's study.  This could represent fatty infiltration. Hypoechoic area adjacent to the gallbladder  fossa could reflect focal fatty sparing.  IMPRESSION: Cholelithiasis and gallbladder sludge.  No evidence of acute cholecystitis.  Suspect fatty infiltration of the liver.                    Original Report Authenticated By: Charlett Nose, M.D.    Dg Chest Portable 1 View  06/04/2012  *RADIOLOGY REPORT*  Clinical Data: Right-sided pain  PORTABLE CHEST - 1 VIEW  Comparison: 05/30/2012  Findings: The heart size and mediastinal contours are within normal limits.  Both lungs are clear.  The visualized skeletal structures are unremarkable.  IMPRESSION: Negative exam.   Original Report Authenticated By: Signa Kell, M.D.    ECG  Sinus Brady rate 49. No  acute ST/T changes.    Similar to old ECG.   ASSESSMENT AND PLAN  1. CAD:  Mr Piedra has had chronic DOE despite stent placement in 08/2010.  He has had no change in the frequency of this symptom and has never had chest pain.  He is able to push mow his yard w/o symptoms.  He tolerated penile procedure well in August.  His ECG shows no acute signs of ischemia.  From a cardiac standpoint he is at low risk of complication in perioperative period and does not require additional cardiac testing at this time.  We will follow along.  Cont  Bb, statin, asa (if feasible).  2. Cholelithiasis: Management per surgery.  3. HTN: BP has remained elevated during this admission. Pain also does not appear to be under control. Reevaluate once patient is pain free and/or after surgical intervention.   4. DM: Management per Medicine.  Signed, Nicolasa Ducking, NP 06/04/2012, 3:28 PM  Patient seen and examined and history reviewed. Agree with above findings and plan. Patient with known single vessel CAD s/p stent more than one year ago. Stable cardiac symptoms. Ecg is normal. He is on good cardiac therapy. Cardiac exam is benign. He is cleared for surgery and is low risk from cardiac standpoint.  Theron Arista [facility] 06/04/2012 3:36 PM

## 2012-06-04 NOTE — Consult Note (Signed)
Reason for Consult: RUQ with gallstones. Referring Physician: Dr. Brooke Dare [name] is an 71 y.o. male.  HPI: The patient is a 71 year old Steve Wiggins male who recently had an episode of right upper quadrant pain. The pain was severe and took him to the emergency department. The pain was associated with nausea and vomiting. As part of his workup he underwent an ultrasound which did show stones in the gallbladder but no gallbladder wall thickening or ductal dilatation. He believes he had a pain similar to this about 3 years ago in [place]. He does have a history of a cardiac stent being placed in 2011 but is only on aspirin.  He returned to the ER again last night due to severe pain that is still persistent.  He was admitted by IM.  He has not had cardiac clearance for his surgery yet.  He was seen in our office 2 days ago by Dr. Carolynne Edouard who was setting the patient up for surgery after cardiac clearance.   Past Medical History  Diagnosis Date  . Diabetes mellitus     type 2  . Hypertension   . Obesity   . Coronary artery disease CARDIOLOGIST- DR COOPER--  LAST VISIT 03-04-2012 IN EPIC    (03-18-2012 PT DENIES S & S)  . Mild carotid artery disease BILATERAL ICA  0-35%    ASYMPTOMATIC   . Hyperlipidemia   . Dyspnea on exertion   . History of head injury FELL OFF LADDER,  1999--  S/P DRAINAGE SUBDERMAL HEMATOMA--  NO RESIDUAL  . GERD (gastroesophageal reflux disease)   . Umbilical hernia NO ISSUES  . History of gastric ulcer   . Penile lesion   . History of gout STABLE  . S/P primary angioplasty with coronary stent     Past Surgical History  Procedure Date  . Cardiovascular stress test 08-28-2010  ([place], [place])    EF 66%/ SMALL AREA PROXIMAL POSTEROLATERAL ISCHEMIA  . Coronary angioplasty with stent placement 09-26-2011  ([place], [place])    DRUG-ELUTING STENT X1 TO FIRST OBTUSE MARGINAL BRANCH CIRCUMFLEX/ MODERATE LESION DIAGINAL BRANCH TREATED MEDICALLY  . Surg for drainage subdural   hematoma 1999  . Transthoracic echocardiogram 08-21-2010    LVF NORMAL/ MILD MITRAL AND TRICUSID REGURG.    History reviewed. No pertinent family history.  Social History:  reports that he has never smoked. He has never used smokeless tobacco. He reports that he does not drink alcohol or use illicit drugs.  Allergies: No Known Allergies  Medications: I have reviewed the patient's current medications.  Results for orders placed during the hospital encounter of 06/03/12 (from the past 48 hour(s))  CBC WITH DIFFERENTIAL     Status: Abnormal   Collection Time   06/03/12 11:18 PM      Component Value Range Comment   WBC 12.3 (*) 4.0 - 10.5 K/uL    RBC 4.41  4.22 - 5.81 MIL/uL    Hemoglobin 13.7  13.0 - 17.0 g/dL    HCT 16.1 (*) 09.6 - 52.0 %    MCV 88.0  78.0 - 100.0 fL    MCH 31.1  26.0 - 34.0 pg    MCHC 35.3  30.0 - 36.0 g/dL    RDW 04.5  40.9 - 81.1 %    Platelets 189  150 - 400 K/uL    Neutrophils Relative 74  43 - 77 %    Neutro Abs 9.1 (*) 1.7 - 7.7 K/uL    Lymphocytes Relative 17  12 - 46 %    Lymphs Abs 2.1  0.7 - 4.0 K/uL    Monocytes Relative 7  3 - 12 %    Monocytes Absolute 0.9  0.1 - 1.0 K/uL    Eosinophils Relative 2  0 - 5 %    Eosinophils Absolute 0.2  0.0 - 0.7 K/uL    Basophils Relative 0  0 - 1 %    Basophils Absolute 0.0  0.0 - 0.1 K/uL   COMPREHENSIVE METABOLIC PANEL     Status: Abnormal   Collection Time   06/03/12 11:18 PM      Component Value Range Comment   Sodium 135  135 - 145 mEq/L    Potassium 4.1  3.5 - 5.1 mEq/L    Chloride 97  96 - 112 mEq/L    CO2 23  19 - 32 mEq/L    Glucose, Bld 167 (*) 70 - 99 mg/dL    BUN 23  6 - 23 mg/dL    Creatinine, Ser 9.60  0.50 - 1.35 mg/dL    Calcium 9.8  8.4 - 45.4 mg/dL    Total Protein 6.9  6.0 - 8.3 g/dL    Albumin 3.8  3.5 - 5.2 g/dL    AST 21  0 - 37 U/L    ALT 20  0 - 53 U/L    Alkaline Phosphatase 47  39 - 117 U/L    Total Bilirubin 0.5  0.3 - 1.2 mg/dL    GFR calc non Af Amer 71 (*) >90 mL/min     GFR calc Af Amer 82 (*) >90 mL/min   LIPASE, BLOOD     Status: Normal   Collection Time   06/03/12 11:18 PM      Component Value Range Comment   Lipase 17  11 - 59 U/L   POCT I-STAT TROPONIN I     Status: Normal   Collection Time   06/03/12 11:23 PM      Component Value Range Comment   Troponin i, poc 0.00  0.00 - 0.08 ng/mL    Comment 3            GLUCOSE, CAPILLARY     Status: Abnormal   Collection Time   06/04/12  4:07 AM      Component Value Range Comment   Glucose-Capillary 154 (*) 70 - 99 mg/dL   GLUCOSE, CAPILLARY     Status: Abnormal   Collection Time   06/04/12  7:50 AM      Component Value Range Comment   Glucose-Capillary 157 (*) 70 - 99 mg/dL     US Abdomen Limited  06/04/2012  *RADIOLOGY REPORT*  Clinical Data:  Abdominal pain, nausea, vomiting.  LIMITED ABDOMINAL ULTRASOUND - RIGHT UPPER QUADRANT  Comparison:  05/30/2012  Findings:  Gallbladder:  Multiple small mobile gallstones as well as sludge within the gallbladder.  No gallbladder wall thickening.  Negative sonographic Murphy's.  Appearance similar to recent study.  Common bile duct:  Normal caliber, 6 mm.  Liver:  Heterogeneous, increased echotexture, more appreciable on today's study.  This could represent fatty infiltration. Hypoechoic area adjacent to the gallbladder fossa could reflect focal fatty sparing.  IMPRESSION: Cholelithiasis and gallbladder sludge.  No evidence of acute cholecystitis.  Suspect fatty infiltration of the liver.                    Original Report Authenticated By: Charlett Nose, M.D.    Dg Chest Portable 1 View  06/04/2012  *RADIOLOGY REPORT*  Clinical Data: Right-sided pain  PORTABLE CHEST - 1 VIEW  Comparison: 05/30/2012  Findings: The heart size and mediastinal contours are within normal limits.  Both lungs are clear.  The visualized skeletal structures are unremarkable.  IMPRESSION: Negative exam.   Original Report Authenticated By: Signa Kell, M.D.     Review of Systems    Constitutional: Negative.   HENT: Negative.   Eyes: Negative.   Respiratory: Negative.   Cardiovascular: Negative.   Gastrointestinal: Positive for abdominal pain. Negative for nausea and vomiting.  Genitourinary: Negative.   Musculoskeletal: Negative.   Skin: Negative.   Neurological: Negative.   Endo/Heme/Allergies: Negative.   Psychiatric/Behavioral: Negative.    Blood pressure 174/78, pulse 73, temperature 97.7 F (36.5 C), temperature source Oral, resp. rate 20, height 5\' 7"  (1.702 m), weight 221 lb 9 oz (100.5 kg), SpO2 95.00%. Physical Exam  Constitutional: He is oriented to person, place, and time. He appears well-developed and well-nourished. No distress.  HENT:  Head: Normocephalic and atraumatic.  Eyes: Conjunctivae normal are normal. Pupils are equal, round, and reactive to light.  Neck: Normal range of motion. Neck supple.  Cardiovascular: Normal rate and regular rhythm.   Respiratory: Effort normal and breath sounds normal.  GI: Bowel sounds are normal. He exhibits distension (mild). There is tenderness (esp in RUQ and epigastric). There is no rebound.  Genitourinary:       Deferred   Musculoskeletal: Normal range of motion.  Neurological: He is alert and oriented to person, place, and time.  Skin: Skin is warm and dry.  Psychiatric: He has a normal mood and affect. His behavior is normal.    Assessment/Plan: 1.Symptomatic cholelithiasis: pain is persistent and not improving at this time, no signs of obstruction.  Patient will need lap chole but needs official cardiac clearance first.  Will ask [place] to see patient as he sees Dr. Excell Seltzer there.  If able to get this today then possibly to OR tomorrow.    Shanikia [facility]le 06/04/2012, 10:21 AM

## 2012-06-04 NOTE — ED Provider Notes (Signed)
History     CSN: [id number]  Arrival date & time 06/03/12  2246   First MD Initiated Contact with Patient 06/03/12 2254      Chief Complaint  Patient presents with  . Cholelithiasis    (Consider location/radiation/quality/duration/timing/severity/associated sxs/prior treatment) HPI HX per PT, has known gallstones, tonight ate chicken and potatoes and about 2 hours later developed severe RUQ ABD pain, feels the same as previous visit when he was diagnosed with gallstones, pain radiates to his back. Mod to severe. No known agrevating or alleviating factors. Took percocet at 8pm with no relief. Has associated N/V no blood. No CP or SOB, hurts to breath. Pain constant tonight. Did see Dr Carolynne Edouard as outpatient and is scheduled for chole next month. Has h/o CAD Past Medical History  Diagnosis Date  . Diabetes mellitus     type 2  . Hypertension   . Obesity   . Coronary artery disease CARDIOLOGIST- DR COOPER--  LAST VISIT 03-04-2012 IN [facility]    (03-18-2012 PT DENIES S & S)  . Mild carotid artery disease BILATERAL ICA  0-35%    ASYMPTOMATIC   . Hyperlipidemia   . Dyspnea on exertion   . History of head injury FELL OFF LADDER,  1999--  S/P DRAINAGE SUBDERMAL HEMATOMA--  NO RESIDUAL  . GERD (gastroesophageal reflux disease)   . Umbilical hernia NO ISSUES  . History of gastric ulcer   . Penile lesion   . History of gout STABLE  . S/P primary angioplasty with coronary stent     Past Surgical History  Procedure Date  . Cardiovascular stress test 08-28-2010  ([place], [place])    EF 66%/ SMALL AREA PROXIMAL POSTEROLATERAL ISCHEMIA  . Coronary angioplasty with stent placement 09-26-2011  ([place], [place])    DRUG-ELUTING STENT X1 TO FIRST OBTUSE MARGINAL BRANCH CIRCUMFLEX/ MODERATE LESION DIAGINAL BRANCH TREATED MEDICALLY  . Surg for drainage subdural  hematoma 1999  . Transthoracic echocardiogram 08-21-2010    LVF NORMAL/ MILD MITRAL AND TRICUSID REGURG.    History reviewed. No pertinent  family history.  History  Substance Use Topics  . Smoking status: Never Smoker   . Smokeless tobacco: Never Used  . Alcohol Use: No      Review of Systems  Constitutional: Negative for fever and chills.  HENT: Negative for neck pain and neck stiffness.   Eyes: Negative for pain.  Respiratory: Negative for shortness of breath.   Cardiovascular: Negative for chest pain.  Gastrointestinal: Positive for nausea, vomiting and abdominal pain.  Genitourinary: Negative for dysuria.  Musculoskeletal: Positive for back pain.  Skin: Negative for rash.  Neurological: Negative for headaches.  All other systems reviewed and are negative.    Allergies  Review of patient's allergies indicates no known allergies.  Home Medications   Current Outpatient Rx  Name  Route  Sig  Dispense  Refill  . ALLOPURINOL 100 MG PO TABS   Oral   Take 100 mg by mouth daily.         Marland Kitchen AMLODIPINE BESYLATE 5 MG PO TABS   Oral   Take 5 mg by mouth every morning.         . ASPIRIN 81 MG PO TABS   Oral   Take 81 mg by mouth every morning.          Marland Kitchen CARVEDILOL 25 MG PO TABS   Oral   Take 1 tablet (25 mg total) by mouth 2 (two) times daily.   60 tablet  11   . DIPHENHYDRAMINE-APAP (SLEEP) 25-500 MG PO TABS   Oral   Take 1 tablet by mouth at bedtime as needed. For sleep         . FENOFIBRATE 145 MG PO TABS   Oral   Take 145 mg by mouth daily.         Marland Kitchen GLIMEPIRIDE 4 MG PO TABS   Oral   Take 4 mg by mouth daily before breakfast.          . HYDROCHLOROTHIAZIDE 25 MG PO TABS   Oral   Take 25 mg by mouth every morning.         Marland Kitchen LISINOPRIL 20 MG PO TABS   Oral   Take 40 mg by mouth every morning.          Marland Kitchen METFORMIN HCL 500 MG PO TABS   Oral   Take 500 mg by mouth 3 (three) times daily.         Marland Kitchen OMEPRAZOLE 40 MG PO CPDR   Oral   Take 40 mg by mouth every evening.          Marland Kitchen ONDANSETRON HCL 4 MG PO TABS   Oral   Take 1 tablet (4 mg total) by mouth every 6 (six)  hours.   12 tablet   0   . OXYCODONE-ACETAMINOPHEN 5-325 MG PO TABS   Oral   Take 1-2 tablets by mouth every 6 (six) hours as needed for pain.   20 tablet   0   . SIMVASTATIN 20 MG PO TABS   Oral   Take 20 mg by mouth every evening.           BP 173/68  Pulse 52  Temp 97.9 F (36.6 C) (Oral)  Resp 22  SpO2 99%  Physical Exam  Constitutional: He is oriented to person, place, and time. He appears well-developed and well-nourished.  HENT:  Head: Normocephalic and atraumatic.  Eyes: Conjunctivae normal and EOM are normal. Pupils are equal, round, and reactive to light.  Neck: Trachea normal. Neck supple. No thyromegaly present.  Cardiovascular: Normal rate, regular rhythm, S1 normal, S2 normal and normal pulses.     No systolic murmur is present   No diastolic murmur is present  Pulses:      Radial pulses are 2+ on the right side, and 2+ on the left side.  Pulmonary/Chest: Effort normal and breath sounds normal. He has no wheezes. He has no rhonchi. He has no rales. He exhibits no tenderness.  Abdominal: Soft. Normal appearance and bowel sounds are normal. There is no CVA tenderness and negative Murphy's sign.       TTP RUQ, neg Murphys sign  Musculoskeletal:       BLE:s Calves nontender, no cords or erythema, negative Homans sign  Neurological: He is alert and oriented to person, place, and time. He has normal strength. No cranial nerve deficit or sensory deficit. GCS eye subscore is 4. GCS verbal subscore is 5. GCS motor subscore is 6.  Skin: Skin is warm and dry. No rash noted. He is not diaphoretic.  Psychiatric: His speech is normal.       Cooperative and appropriate    ED Course  Procedures (including critical care time)  Results for orders placed during the hospital encounter of 06/03/12  CBC WITH DIFFERENTIAL      Component Value Range   WBC 12.3 (*) 4.0 - 10.5 K/uL   RBC 4.41  4.22 - 5.81 MIL/uL  Hemoglobin 13.7  13.0 - 17.0 g/dL   HCT 11.9 (*) 14.7 -  52.0 %   MCV 88.0  78.0 - 100.0 fL   MCH 31.1  26.0 - 34.0 pg   MCHC 35.3  30.0 - 36.0 g/dL   RDW 82.9  56.2 - 13.0 %   Platelets 189  150 - 400 K/uL   Neutrophils Relative 74  43 - 77 %   Neutro Abs 9.1 (*) 1.7 - 7.7 K/uL   Lymphocytes Relative 17  12 - 46 %   Lymphs Abs 2.1  0.7 - 4.0 K/uL   Monocytes Relative 7  3 - 12 %   Monocytes Absolute 0.9  0.1 - 1.0 K/uL   Eosinophils Relative 2  0 - 5 %   Eosinophils Absolute 0.2  0.0 - 0.7 K/uL   Basophils Relative 0  0 - 1 %   Basophils Absolute 0.0  0.0 - 0.1 K/uL  COMPREHENSIVE METABOLIC PANEL      Component Value Range   Sodium 135  135 - 145 mEq/L   Potassium 4.1  3.5 - 5.1 mEq/L   Chloride 97  96 - 112 mEq/L   CO2 23  19 - 32 mEq/L   Glucose, Bld 167 (*) 70 - 99 mg/dL   BUN 23  6 - 23 mg/dL   Creatinine, Ser 8.65  0.50 - 1.35 mg/dL   Calcium 9.8  8.4 - 78.4 mg/dL   Total Protein 6.9  6.0 - 8.3 g/dL   Albumin 3.8  3.5 - 5.2 g/dL   AST 21  0 - 37 U/L   ALT 20  0 - 53 U/L   Alkaline Phosphatase 47  39 - 117 U/L   Total Bilirubin 0.5  0.3 - 1.2 mg/dL   GFR calc non Af Amer 71 (*) >90 mL/min   GFR calc Af Amer 82 (*) >90 mL/min  LIPASE, BLOOD      Component Value Range   Lipase 17  11 - 59 U/L  POCT I-STAT TROPONIN I      Component Value Range   Troponin i, poc 0.00  0.00 - 0.08 ng/mL   Comment 3              US Abdomen Limited  06/04/2012  *RADIOLOGY REPORT*  Clinical Data:  Abdominal pain, nausea, vomiting.  LIMITED ABDOMINAL ULTRASOUND - RIGHT UPPER QUADRANT  Comparison:  05/30/2012  Findings:  Gallbladder:  Multiple small mobile gallstones as well as sludge within the gallbladder.  No gallbladder wall thickening.  Negative sonographic Murphy's.  Appearance similar to recent study.  Common bile duct:  Normal caliber, 6 mm.  Liver:  Heterogeneous, increased echotexture, more appreciable on today's study.  This could represent fatty infiltration. Hypoechoic area adjacent to the gallbladder fossa could reflect focal fatty  sparing.  IMPRESSION: Cholelithiasis and gallbladder sludge.  No evidence of acute cholecystitis.  Suspect fatty infiltration of the liver.                    Original Report Authenticated By: Charlett Nose, M.D.    Dg Chest Portable 1 View  06/04/2012  *RADIOLOGY REPORT*  Clinical Data: Right-sided pain  PORTABLE CHEST - 1 VIEW  Comparison: 05/30/2012  Findings: The heart size and mediastinal contours are within normal limits.  Both lungs are clear.  The visualized skeletal structures are unremarkable.  IMPRESSION: Negative exam.   Original Report Authenticated By: Signa Kell, M.D.     IVFs. NPO.  IV narcotics. No relief with fentanyl x 2, given IV dilaudid   Date: 06/04/2012  Rate: 49  Rhythm: sinus bradycardia  QRS Axis: normal  Intervals: normal  ST/T Wave abnormalities: nonspecific ST changes  Conduction Disutrbances:none  Narrative Interpretation:   Old EKG Reviewed: unchanged  1:26 AM d/w [facility] Dr Corliss Skains, will attempt pain control, if unable will c/s MED -   1:56 AM requiring IV dilaudid 9/10 pain, d/w Dr Houston Siren, will admit, needs MED clearance for OR  MDM   Symptomatic cholelithiasis [place]. Labs. ECG. IV narcotics.  [facility] consult and MED admit        Sunnie Nielsen, MD 06/04/12 [phone]

## 2012-06-04 NOTE — ED Notes (Signed)
Assisted pt to bathroom

## 2012-06-04 NOTE — H&P (Signed)
[facility] History and Physical  MAVRICK MCQUIGG ZOX:[id number] DOB: [birth year]    PCP:   [name], MD   Chief Complaint: abdominal pain.  HPI: Steve Wiggins is an 71 y.o. male with hx of CAD, s/p one cardiac stent placement about 2 years ago in [place], [place], Hx of recent diagnosis of symptomatic cholelithiasis, seen by Dr Carolynne Edouard, and planned for lap CCY after cardiac clearance, returned to ER because of increased RUQ pain after eating today.  He has some nausea, but no vomiting, no fever, chills, black or bloody stool.  No substernal CP, or shortness of breath.  Since he had his cardiac stent, he denied having any chest pain with exertion.  Evaluation in the ER include an EKG with no acute ST-T changes, mild leukocytosis with WBC of 13K, normal lipase, LFTs, renal fx tests, electrolytes, and initial cardiac marker.  Hospitalist was asked to admit this patient for pain control, and for surgical consult concerning lap CCY.  Rewiew of Systems:  Constitutional: Negative for malaise, fever and chills. No significant weight loss or weight gain Eyes: Negative for eye pain, redness and discharge, diplopia, visual changes, or flashes of light. ENMT: Negative for ear pain, hoarseness, nasal congestion, sinus pressure and sore throat. No headaches; tinnitus, drooling, or problem swallowing. Cardiovascular: Negative for chest pain, palpitations, diaphoresis, dyspnea and peripheral edema. ; No orthopnea, PND Respiratory: Negative for cough, hemoptysis, wheezing and stridor. No pleuritic chestpain. Gastrointestinal: Negative for vomiting, diarrhea, constipation,  melena, blood in stool, hematemesis, jaundice and rectal bleeding.    Genitourinary: Negative for frequency, dysuria, incontinence,flank pain and hematuria; Musculoskeletal: Negative for back pain and neck pain. Negative for swelling and trauma.;  Skin: . Negative for pruritus, rash, abrasions, bruising and skin lesion.;  ulcerations Neuro: Negative for headache, lightheadedness and neck stiffness. Negative for weakness, altered level of consciousness , altered mental status, extremity weakness, burning feet, involuntary movement, seizure and syncope.  Psych: negative for anxiety, depression, insomnia, tearfulness, panic attacks, hallucinations, paranoia, suicidal or homicidal ideation    Past Medical History  Diagnosis Date  . Diabetes mellitus     type 2  . Hypertension   . Obesity   . Coronary artery disease CARDIOLOGIST- DR COOPER--  LAST VISIT 03-04-2012 IN [facility]    (03-18-2012 PT DENIES S & S)  . Mild carotid artery disease BILATERAL ICA  0-35%    ASYMPTOMATIC   . Hyperlipidemia   . Dyspnea on exertion   . History of head injury FELL OFF LADDER,  1999--  S/P DRAINAGE SUBDERMAL HEMATOMA--  NO RESIDUAL  . GERD (gastroesophageal reflux disease)   . Umbilical hernia NO ISSUES  . History of gastric ulcer   . Penile lesion   . History of gout STABLE  . S/P primary angioplasty with coronary stent     Past Surgical History  Procedure Date  . Cardiovascular stress test 08-28-2010  ([place], [place])    EF 66%/ SMALL AREA PROXIMAL POSTEROLATERAL ISCHEMIA  . Coronary angioplasty with stent placement 09-26-2011  ([place], [place])    DRUG-ELUTING STENT X1 TO FIRST OBTUSE MARGINAL BRANCH CIRCUMFLEX/ MODERATE LESION DIAGINAL BRANCH TREATED MEDICALLY  . Surg for drainage subdural  hematoma 1999  . Transthoracic echocardiogram 08-21-2010    LVF NORMAL/ MILD MITRAL AND TRICUSID REGURG.    Medications:  HOME MEDS: Prior to Admission medications   Medication Sig Start Date End Date Taking? Authorizing Provider  allopurinol (ZYLOPRIM) 100 MG tablet Take 100 mg by mouth daily.  Yes Historical Provider, MD  amLODipine (NORVASC) 5 MG tablet Take 5 mg by mouth every morning. 10/16/11  Yes Tonny Bollman, MD  aspirin 81 MG tablet Take 81 mg by mouth every morning.    Yes Historical Provider, MD  carvedilol (COREG) 25  MG tablet Take 1 tablet (25 mg total) by mouth 2 (two) times daily. 11/05/11 11/04/12 Yes Tonny Bollman, MD  diphenhydramine-acetaminophen (TYLENOL PM) 25-500 MG TABS Take 1 tablet by mouth at bedtime as needed. For sleep   Yes Historical Provider, MD  fenofibrate (TRICOR) 145 MG tablet Take 145 mg by mouth daily.   Yes Historical Provider, MD  glimepiride (AMARYL) 4 MG tablet Take 4 mg by mouth daily before breakfast.    Yes Historical Provider, MD  hydrochlorothiazide (HYDRODIURIL) 25 MG tablet Take 25 mg by mouth every morning. 02/12/12  Yes Tonny Bollman, MD  lisinopril (PRINIVIL,ZESTRIL) 20 MG tablet Take 40 mg by mouth every morning.    Yes Historical Provider, MD  metFORMIN (GLUCOPHAGE) 500 MG tablet Take 500 mg by mouth 3 (three) times daily.   Yes Historical Provider, MD  omeprazole (PRILOSEC) 40 MG capsule Take 40 mg by mouth every evening.    Yes Historical Provider, MD  ondansetron (ZOFRAN) 4 MG tablet Take 1 tablet (4 mg total) by mouth every 6 (six) hours. 05/30/12  Yes Fayrene Helper, PA-C  oxyCODONE-acetaminophen (PERCOCET/ROXICET) 5-325 MG per tablet Take 1-2 tablets by mouth every 6 (six) hours as needed for pain. 05/30/12  Yes Fayrene Helper, PA-C  simvastatin (ZOCOR) 20 MG tablet Take 20 mg by mouth every evening.   Yes Historical Provider, MD     Allergies:  No Known Allergies  Social History:   reports that he has never smoked. He has never used smokeless tobacco. He reports that he does not drink alcohol or use illicit drugs.  Family History: History reviewed. No pertinent family history.   Physical Exam: Filed Vitals:   06/03/12 2315 06/03/12 2330 06/03/12 2350 06/04/12 0020  BP: 180/72 182/74 173/66 157/66  Pulse: 53 57 65 51  Temp:      TempSrc:      Resp: 22 24 15 16   SpO2: 99% 95% 98% 94%   Blood pressure 157/66, pulse 51, temperature 97.9 F (36.6 C), temperature source Oral, resp. rate 16, SpO2 94.00%.  GEN:  Pleasant  patient lying in the stretcher in no acute  distress; cooperative with exam. PSYCH:  alert and oriented x4; does not appear anxious or depressed; affect is appropriate. HEENT: Mucous membranes pink and anicteric; PERRLA; EOM intact; no cervical lymphadenopathy nor thyromegaly or carotid bruit; no JVD; There were no stridor. Neck is very supple. Breasts:: Not examined CHEST WALL: No tenderness CHEST: Normal respiration, clear to auscultation bilaterally.  HEART: Regular rate and rhythm.  There are no murmur, rub, or gallops.   BACK: No kyphosis or scoliosis; no CVA tenderness ABDOMEN: soft and tender in the RUQ, no masses, no organomegaly, normal abdominal bowel sounds; no pannus; no intertriginous candida. There is no rebound and no distention. Rectal Exam: Not done EXTREMITIES: No bone or joint deformity; age-appropriate arthropathy of the hands and knees; no edema; no ulcerations.  There is no calf tenderness. Genitalia: not examined PULSES: 2+ and symmetric SKIN: Normal hydration no rash or ulceration CNS: Cranial nerves 2-12 grossly intact no focal lateralizing neurologic deficit.  Speech is fluent; uvula elevated with phonation, facial symmetry and tongue midline. DTR are normal bilaterally, cerebella exam is intact, barbinski is negative and  strengths are equaled bilaterally.  No sensory loss.   Labs on Admission:  Basic Metabolic Panel:  Lab 06/03/12 8295 05/30/12 0611  NA 135 137  K 4.1 3.6  CL 97 100  CO2 23 24  GLUCOSE 167* 278*  BUN 23 17  CREATININE 1.05 0.90  CALCIUM 9.8 9.4  MG -- --  PHOS -- --   Liver Function Tests:  Lab 06/03/12 2318 05/30/12 0611  AST 21 17  ALT 20 16  ALKPHOS 47 53  BILITOT 0.5 0.5  PROT 6.9 6.8  ALBUMIN 3.8 3.7    Lab 06/03/12 2318 05/30/12 0611  LIPASE 17 17  AMYLASE -- --   No results found for this basename: AMMONIA:5 in the last 168 hours CBC:  Lab 06/03/12 2318 05/30/12 0611  WBC 12.3* 9.3  NEUTROABS 9.1* 7.3  HGB 13.7 13.5  HCT 38.8* 37.9*  MCV 88.0 88.6  PLT  189 163   Cardiac Enzymes:  Lab 05/30/12 0612  CKTOTAL --  CKMB --  CKMBINDEX --  TROPONINI <0.30    CBG: No results found for this basename: GLUCAP:5 in the last 168 hours   Radiological Exams on Admission: US Abdomen Limited  06/04/2012  *RADIOLOGY REPORT*  Clinical Data:  Abdominal pain, nausea, vomiting.  LIMITED ABDOMINAL ULTRASOUND - RIGHT UPPER QUADRANT  Comparison:  05/30/2012  Findings:  Gallbladder:  Multiple small mobile gallstones as well as sludge within the gallbladder.  No gallbladder wall thickening.  Negative sonographic Murphy's.  Appearance similar to recent study.  Common bile duct:  Normal caliber, 6 mm.  Liver:  Heterogeneous, increased echotexture, more appreciable on today's study.  This could represent fatty infiltration. Hypoechoic area adjacent to the gallbladder fossa could reflect focal fatty sparing.  IMPRESSION: Cholelithiasis and gallbladder sludge.  No evidence of acute cholecystitis.  Suspect fatty infiltration of the liver.                    Original Report Authenticated By: Charlett Nose, M.D.    Dg Chest Portable 1 View  06/04/2012  *RADIOLOGY REPORT*  Clinical Data: Right-sided pain  PORTABLE CHEST - 1 VIEW  Comparison: 05/30/2012  Findings: The heart size and mediastinal contours are within normal limits.  Both lungs are clear.  The visualized skeletal structures are unremarkable.  IMPRESSION: Negative exam.   Original Report Authenticated By: Signa Kell, M.D.     EKG: Independently reviewed. SR with no acute ST-T changes.   Assessment/Plan Present on Admission:  . Cholelithiasis . Hypertension . Hyperlipidemia . Coronary atherosclerosis of native coronary artery   PLAN: Will admit him for symptomatic cholelithiasis.  He will be placed on NPO anticipating lap CCY.  I will give Dilaudid and antiemetics.  He has CAD, but after the stent, remained asymptomatic, so no further cardiac testing is indicated.  He has been on Coreg, so this should  be continued.  For his DM, since he is NPO, I will stop his oral hypoglycemic agents, and use SSI to cover hyperglycemia.   Accepting an increased peri-operative cardiovascular complication, he is cleared for surgery.  Please consult surgery in the am.  Dr Carolynne Edouard has seen him before.  He is stable, full code, and will be admitted to [facility] service.  Other plans as per orders.  Code Status: FULL Unk Lightning, MD. [facility] Pager [phone] 7pm to 7am.  06/04/2012, 2:19 AM

## 2012-06-04 NOTE — Consult Note (Signed)
Will plan lap chole tomorrow.  Discussed risks and benefits with patient.

## 2012-06-04 NOTE — ED Notes (Signed)
Pt up to b/r and back w/o incident or change, ambulatory with steady gait, urine sample obtained, saved at [facility].

## 2012-06-05 ENCOUNTER — Encounter (HOSPITAL_COMMUNITY)
Admission: EM | Disposition: A | Discharge status: Home / Self Care | Payer: Self-pay | Source: Home / Self Care | Attending: Internal Medicine

## 2012-06-05 ENCOUNTER — Observation Stay (HOSPITAL_COMMUNITY): Payer: Medicare Other | Admitting: Anesthesiology

## 2012-06-05 ENCOUNTER — Encounter (HOSPITAL_COMMUNITY): Payer: Self-pay | Admitting: Anesthesiology

## 2012-06-05 DIAGNOSIS — N390 Urinary tract infection, site not specified: Secondary | ICD-10-CM

## 2012-06-05 HISTORY — PX: CHOLECYSTECTOMY: SHX55

## 2012-06-05 HISTORY — PX: UMBILICAL HERNIA REPAIR: SHX196

## 2012-06-05 LAB — URINALYSIS, ROUTINE W REFLEX MICROSCOPIC
Glucose, UA: NEGATIVE mg/dL
Hgb urine dipstick: NEGATIVE
Specific Gravity, Urine: 1.027 (ref 1.005–1.030)
Urobilinogen, UA: 1 mg/dL (ref 0.0–1.0)

## 2012-06-05 LAB — URINE MICROSCOPIC-ADD ON

## 2012-06-05 LAB — GLUCOSE, CAPILLARY
Glucose-Capillary: 154 mg/dL — ABNORMAL HIGH (ref 70–99)
Glucose-Capillary: 137 mg/dL — ABNORMAL HIGH (ref 70–99)

## 2012-06-05 LAB — MRSA PCR SCREENING: MRSA by PCR: NEGATIVE

## 2012-06-05 SURGERY — LAPAROSCOPIC CHOLECYSTECTOMY
Anesthesia: General | Site: Abdomen | Wound class: Dirty or Infected

## 2012-06-05 MED ORDER — LIDOCAINE HCL (CARDIAC) 20 MG/ML IV SOLN
INTRAVENOUS | Status: DC | PRN
  Administered 2012-06-05: 60 mg via INTRAVENOUS

## 2012-06-05 MED ORDER — GLYCOPYRROLATE 0.2 MG/ML IJ SOLN
INTRAMUSCULAR | Status: DC | PRN
  Administered 2012-06-05: .8 mg via INTRAVENOUS

## 2012-06-05 MED ORDER — BUPIVACAINE-EPINEPHRINE 0.25% -1:200000 IJ SOLN
INTRAMUSCULAR | Status: DC | PRN
  Administered 2012-06-05: 30 mL

## 2012-06-05 MED ORDER — LACTATED RINGERS IV SOLN
INTRAVENOUS | Status: DC | PRN
  Administered 2012-06-05 (×2): via INTRAVENOUS

## 2012-06-05 MED ORDER — NEOSTIGMINE METHYLSULFATE 1 MG/ML IJ SOLN
INTRAMUSCULAR | Status: DC | PRN
  Administered 2012-06-05: 4 mg via INTRAVENOUS

## 2012-06-05 MED ORDER — SODIUM CHLORIDE 0.9 % IV SOLN: INTRAVENOUS | Status: DC

## 2012-06-05 MED ORDER — LACTATED RINGERS IV SOLN
INTRAVENOUS | Status: DC
  Administered 2012-06-05: 09:00:00 via INTRAVENOUS

## 2012-06-05 MED ORDER — VECURONIUM BROMIDE 10 MG IV SOLR
INTRAVENOUS | Status: DC | PRN
  Administered 2012-06-05: 4 mg via INTRAVENOUS
  Administered 2012-06-05: 9 mg via INTRAVENOUS

## 2012-06-05 MED ORDER — ONDANSETRON HCL 4 MG/2ML IJ SOLN
INTRAMUSCULAR | Status: DC | PRN
  Administered 2012-06-05: 4 mg via INTRAVENOUS

## 2012-06-05 MED ORDER — HEMOSTATIC AGENTS (NO CHARGE) OPTIME
TOPICAL | Status: DC | PRN
  Administered 2012-06-05: 1 via TOPICAL

## 2012-06-05 MED ORDER — CHLORHEXIDINE GLUCONATE CLOTH 2 % EX PADS: 6.0000 | MEDICATED_PAD | Freq: Every day | CUTANEOUS | Status: DC

## 2012-06-05 MED ORDER — MUPIROCIN 2 % EX OINT
1.0000 "application " | TOPICAL_OINTMENT | Freq: Two times a day (BID) | CUTANEOUS | Status: DC
  Filled 2012-06-05: qty 22

## 2012-06-05 MED ORDER — OXYCODONE HCL 5 MG PO TABS
5.0000 mg | ORAL_TABLET | ORAL | Status: DC | PRN
  Administered 2012-06-06 – 2012-06-09 (×6): 5 mg via ORAL
  Filled 2012-06-05 (×7): qty 1

## 2012-06-05 MED ORDER — FENTANYL CITRATE 0.05 MG/ML IJ SOLN
INTRAMUSCULAR | Status: DC | PRN
  Administered 2012-06-05: 150 ug via INTRAVENOUS
  Administered 2012-06-05 (×2): 50 ug via INTRAVENOUS

## 2012-06-05 MED ORDER — BUPIVACAINE-EPINEPHRINE PF 0.25-1:200000 % IJ SOLN
INTRAMUSCULAR | Status: AC
  Filled 2012-06-05: qty 30

## 2012-06-05 MED ORDER — HYDROMORPHONE HCL PF 1 MG/ML IJ SOLN
0.2500 mg | INTRAMUSCULAR | Status: DC | PRN
  Administered 2012-06-05 (×2): 0.25 mg via INTRAVENOUS

## 2012-06-05 MED ORDER — SODIUM CHLORIDE 0.9 % IR SOLN
Status: DC | PRN
  Administered 2012-06-05: 1000 mL

## 2012-06-05 MED ORDER — ACETAMINOPHEN 325 MG PO TABS
650.0000 mg | ORAL_TABLET | Freq: Four times a day (QID) | ORAL | Status: DC | PRN
  Administered 2012-06-05: 650 mg via ORAL
  Filled 2012-06-05: qty 2

## 2012-06-05 MED ORDER — DEXTROSE 5 % IV SOLN
1.0000 g | INTRAVENOUS | Status: DC
  Administered 2012-06-05 (×2): 1 g via INTRAVENOUS
  Filled 2012-06-05 (×3): qty 10

## 2012-06-05 MED ORDER — PROPOFOL 10 MG/ML IV BOLUS
INTRAVENOUS | Status: DC | PRN
  Administered 2012-06-05: 150 mg via INTRAVENOUS

## 2012-06-05 SURGICAL SUPPLY — 48 items
APPLIER CLIP 5 13 M/L LIGAMAX5 (MISCELLANEOUS) ×2
BENZOIN TINCTURE PRP APPL 2/3 (GAUZE/BANDAGES/DRESSINGS) ×2 IMPLANT
BLADE SURG ROTATE 9660 (MISCELLANEOUS) ×2 IMPLANT
CANISTER SUCTION 2500CC (MISCELLANEOUS) ×2 IMPLANT
CHLORAPREP W/TINT 26ML (MISCELLANEOUS) ×2 IMPLANT
CLIP APPLIE 5 13 M/L LIGAMAX5 (MISCELLANEOUS) ×1 IMPLANT
CLOTH BEACON ORANGE TIMEOUT ST (SAFETY) ×2 IMPLANT
CLSR STERI-STRIP ANTIMIC 1/2X4 (GAUZE/BANDAGES/DRESSINGS) ×2 IMPLANT
COVER MAYO STAND STRL (DRAPES) ×2 IMPLANT
COVER SURGICAL LIGHT HANDLE (MISCELLANEOUS) ×2 IMPLANT
DECANTER SPIKE VIAL GLASS SM (MISCELLANEOUS) ×2 IMPLANT
DERMABOND ADVANCED (GAUZE/BANDAGES/DRESSINGS) ×1
DERMABOND ADVANCED .7 DNX12 (GAUZE/BANDAGES/DRESSINGS) ×1 IMPLANT
DRAPE C-ARM 42X72 X-RAY (DRAPES) ×2 IMPLANT
DRSG TEGADERM 4X4.75 (GAUZE/BANDAGES/DRESSINGS) ×2 IMPLANT
ELECT REM PT RETURN 9FT ADLT (ELECTROSURGICAL) ×2
ELECTRODE REM PT RTRN 9FT ADLT (ELECTROSURGICAL) ×1 IMPLANT
GAUZE SPONGE 2X2 8PLY STRL LF (GAUZE/BANDAGES/DRESSINGS) ×1 IMPLANT
GLOVE BIO SURGEON STRL SZ7 (GLOVE) ×2 IMPLANT
GLOVE BIOGEL PI IND STRL 7.0 (GLOVE) ×1 IMPLANT
GLOVE BIOGEL PI IND STRL 7.5 (GLOVE) ×1 IMPLANT
GLOVE BIOGEL PI INDICATOR 7.0 (GLOVE) ×1
GLOVE BIOGEL PI INDICATOR 7.5 (GLOVE) ×1
GLOVE SS BIOGEL STRL SZ 6.5 (GLOVE) ×1 IMPLANT
GLOVE SUPERSENSE BIOGEL SZ 6.5 (GLOVE) ×1
GOWN STRL NON-REIN LRG LVL3 (GOWN DISPOSABLE) ×8 IMPLANT
HEMOSTAT SNOW SURGICEL 2X4 (HEMOSTASIS) ×2 IMPLANT
KIT BASIN OR (CUSTOM PROCEDURE TRAY) ×2 IMPLANT
KIT ROOM TURNOVER OR (KITS) ×2 IMPLANT
NS IRRIG 1000ML POUR BTL (IV SOLUTION) ×2 IMPLANT
PAD ARMBOARD 7.5X6 YLW CONV (MISCELLANEOUS) ×2 IMPLANT
PENCIL BUTTON HOLSTER BLD 10FT (ELECTRODE) ×2 IMPLANT
POUCH SPECIMEN RETRIEVAL 10MM (ENDOMECHANICALS) ×2 IMPLANT
SCISSORS LAP 5X35 DISP (ENDOMECHANICALS) ×2 IMPLANT
SET CHOLANGIOGRAPH 5 50 .035 (SET/KITS/TRAYS/PACK) ×2 IMPLANT
SET IRRIG TUBING LAPAROSCOPIC (IRRIGATION / IRRIGATOR) ×2 IMPLANT
SLEEVE ENDOPATH XCEL 5M (ENDOMECHANICALS) ×4 IMPLANT
SPECIMEN JAR SMALL (MISCELLANEOUS) ×2 IMPLANT
SPONGE GAUZE 2X2 STER 10/PKG (GAUZE/BANDAGES/DRESSINGS) ×1
SUT MNCRL AB 4-0 PS2 18 (SUTURE) ×2 IMPLANT
SUT VIC AB 3-0 SH 27 (SUTURE) ×1
SUT VIC AB 3-0 SH 27X BRD (SUTURE) ×1 IMPLANT
SUT VICRYL 0 UR6 27IN ABS (SUTURE) ×2 IMPLANT
TOWEL OR 17X24 6PK STRL BLUE (TOWEL DISPOSABLE) ×2 IMPLANT
TOWEL OR 17X26 10 PK STRL BLUE (TOWEL DISPOSABLE) ×2 IMPLANT
TRAY LAPAROSCOPIC (CUSTOM PROCEDURE TRAY) ×2 IMPLANT
TROCAR XCEL BLUNT TIP 100MML (ENDOMECHANICALS) ×2 IMPLANT
TROCAR XCEL NON-BLD 5MMX100MML (ENDOMECHANICALS) ×2 IMPLANT

## 2012-06-05 NOTE — Anesthesia Postprocedure Evaluation (Signed)
  Anesthesia Post-op Note  Patient: Steve Wiggins  Procedure(s) Performed: Procedure(s) (LRB) with comments: LAPAROSCOPIC CHOLECYSTECTOMY (N/A) HERNIA REPAIR UMBILICAL ADULT (N/A) - Primary Umbilical Hernia Repair  Patient Location: PACU  Anesthesia Type:General  Level of Consciousness: awake  Airway and Oxygen Therapy: Patient Spontanous Breathing  Post-op Pain: mild  Post-op Assessment: Post-op Vital signs reviewed  Post-op Vital Signs: Reviewed  Complications: No apparent anesthesia complications

## 2012-06-05 NOTE — Anesthesia Preprocedure Evaluation (Addendum)
Anesthesia Evaluation  Patient identified by MRN, date of birth, ID band Patient awake    Reviewed: Allergy & Precautions, H&P , NPO status , Patient's Chart, lab work & pertinent test results  Airway Mallampati: III      Dental  (+) Poor Dentition   Pulmonary  breath sounds clear to auscultation        Cardiovascular hypertension, Pt. on medications + CAD - dysrhythmias Rhythm:Regular Rate:Normal     Neuro/Psych    GI/Hepatic Neg liver ROS, GERD-  ,  Endo/Other  diabetes, Type 2  Renal/GU negative Renal ROS     Musculoskeletal   Abdominal   Peds  Hematology   Anesthesia Other Findings   Reproductive/Obstetrics                         Anesthesia Physical Anesthesia Plan  ASA: III  Anesthesia Plan: General   Post-op Pain Management:    Induction: Intravenous  Airway Management Planned: Oral ETT  Additional Equipment:   Intra-op Plan:   Post-operative Plan: Extubation in OR  Informed Consent: I have reviewed the patients History and Physical, chart, labs and discussed the procedure including the risks, benefits and alternatives for the proposed anesthesia with the patient or authorized representative who has indicated his/her understanding and acceptance.   Dental Advisory Given  Plan Discussed with: CRNA, Anesthesiologist and Surgeon  Anesthesia Plan Comments:        Anesthesia Quick Evaluation

## 2012-06-05 NOTE — Progress Notes (Signed)
Per MD on call to give pt 650 mg of tylenol for fever, blood cultures, and an urine culture to be collected. RN will recheck bp and continue to monitor pt.

## 2012-06-05 NOTE — Progress Notes (Signed)
Pt had an elevated temp of 101.3 and an elevated bp of 163/70. RN paged MD on call and is awaiting further orders. RN will continue to monitor pt.

## 2012-06-05 NOTE — Progress Notes (Signed)
Seen at 7:50 Am [facility] PROGRESS NOTE  Steve Wiggins:[id number] DOB: [birth year] DOA: 06/03/2012 PCP: [name], MD  HPI/Subjective: RUQ and right flank pain   Assessment/Plan:  UTI -Patient had fever of 101.5 last night, urinalysis consistent with UTI. -Urine culture and blood culture were sent. Patient will be started on Rocephin. York Spaniel he has history of recurrent UTIs which was treated successfully with ciprofloxacin.   Cholelithiasis -Symptomatic cholelithiasis, patient is for laparoscopic cholecystectomy today per general surgery.  Diabetes mellitus type 2  -Patient home oral hypoglycemic agents on hold. -SSI was in the hospital, resume his oral meds on discharge.  Hypertension and hyperlipidemia -Stable, cardiology evaluated the patient for cardiac risk stratification before surgery. -Continue home medications.   Code Status: Full code  Family Communication:  Disposition Plan:    Procedures:  Laparoscopic cholecystectomy scheduled for 06/05/2012   Antibiotics:  Saphenous start 06/05/2012    Objective: Filed Vitals:   06/04/12 2129 06/05/12 0538 06/05/12 0617 06/05/12 0815  BP: 131/74 163/70 152/82 151/73  Pulse: 70 81  72  Temp: 99.1 F (37.3 C) 101.3 F (38.5 C)  98.1 F (36.7 C)  TempSrc: Oral Oral  Oral  Resp: 20 18  20   Height:      Weight:      SpO2: 91% 90%  93%    Intake/Output Summary (Last 24 hours) at 06/05/12 1122 Last data filed at 06/05/12 0900  Gross per 24 hour  Intake   2875 ml  Output   1051 ml  Net   1824 ml   Filed Weights   06/04/12 0300  Weight: 100.5 kg (221 lb 9 oz)    Exam:  General: Alert and awake, oriented x3, not in any acute distress. HEENT: anicteric sclera, pupils reactive to light and accommodation, EOMI CVS: S1-S2 clear, no murmur rubs or gallops Chest: clear to auscultation bilaterally, no wheezing, rales or rhonchi Abdomen: soft nontender, nondistended, normal bowel sounds, no  organomegaly Extremities: no cyanosis, clubbing or edema noted bilaterally Neuro: Cranial nerves II-XII intact, no focal neurological deficits  Data Reviewed: Basic Metabolic Panel:  Lab 06/03/12 8119 05/30/12 0611  NA 135 137  K 4.1 3.6  CL 97 100  CO2 23 24  GLUCOSE 167* 278*  BUN 23 17  CREATININE 1.05 0.90  CALCIUM 9.8 9.4  MG -- --  PHOS -- --   Liver Function Tests:  Lab 06/03/12 2318 05/30/12 0611  AST 21 17  ALT 20 16  ALKPHOS 47 53  BILITOT 0.5 0.5  PROT 6.9 6.8  ALBUMIN 3.8 3.7    Lab 06/03/12 2318 05/30/12 0611  LIPASE 17 17  AMYLASE -- --   No results found for this basename: AMMONIA:5 in the last 168 hours CBC:  Lab 06/03/12 2318 05/30/12 0611  WBC 12.3* 9.3  NEUTROABS 9.1* 7.3  HGB 13.7 13.5  HCT 38.8* 37.9*  MCV 88.0 88.6  PLT 189 163   Cardiac Enzymes:  Lab 05/30/12 0612  CKTOTAL --  CKMB --  CKMBINDEX --  TROPONINI <0.30   BNP (last 3 results) No results found for this basename: PROBNP:3 in the last 8760 hours CBG:  Lab 06/05/12 0746 06/05/12 0416 06/05/12 0112 06/04/12 2000 06/04/12 1547  GLUCAP 166* 156* 154* 136* 129*    Recent Results (from the past 240 hour(s))  MRSA PCR SCREENING     Status: Normal   Collection Time   06/05/12  8:46 AM      Component Value Range Status  Comment   MRSA by PCR NEGATIVE  NEGATIVE Final      Studies: US Abdomen Limited  06/04/2012  *RADIOLOGY REPORT*  Clinical Data:  Abdominal pain, nausea, vomiting.  LIMITED ABDOMINAL ULTRASOUND - RIGHT UPPER QUADRANT  Comparison:  05/30/2012  Findings:  Gallbladder:  Multiple small mobile gallstones as well as sludge within the gallbladder.  No gallbladder wall thickening.  Negative sonographic Murphy's.  Appearance similar to recent study.  Common bile duct:  Normal caliber, 6 mm.  Liver:  Heterogeneous, increased echotexture, more appreciable on today's study.  This could represent fatty infiltration. Hypoechoic area adjacent to the gallbladder fossa could  reflect focal fatty sparing.  IMPRESSION: Cholelithiasis and gallbladder sludge.  No evidence of acute cholecystitis.  Suspect fatty infiltration of the liver.                    Original Report Authenticated By: Charlett Nose, M.D.    Dg Chest Portable 1 View  06/04/2012  *RADIOLOGY REPORT*  Clinical Data: Right-sided pain  PORTABLE CHEST - 1 VIEW  Comparison: 05/30/2012  Findings: The heart size and mediastinal contours are within normal limits.  Both lungs are clear.  The visualized skeletal structures are unremarkable.  IMPRESSION: Negative exam.   Original Report Authenticated By: Signa Kell, M.D.     Scheduled Meds:    . allopurinol  100 mg Oral Daily  . amLODipine  5 mg Oral q morning - 10a  . aspirin  81 mg Oral Daily  . carvedilol  25 mg Oral BID WC  .  ceFAZolin (ANCEF) IV  2 g Intravenous On Call to OR  . [COMPLETED] chlorhexidine  1 application Topical Once  . Chlorhexidine Gluconate Cloth  6 each Topical Q0600  . fenofibrate  160 mg Oral Daily  . heparin  5,000 Units Subcutaneous Q8H  . insulin aspart  0-15 Units Subcutaneous Q4H  . lisinopril  40 mg Oral q morning - 10a  . pantoprazole  80 mg Oral Daily  . pneumococcal 23 valent vaccine  0.5 mL Intramuscular Tomorrow-1000  . simvastatin  20 mg Oral QPM  . [DISCONTINUED] chlorhexidine  1 application Topical Once  . [DISCONTINUED] mupirocin ointment  1 application Nasal BID   Continuous Infusions:    . sodium chloride 125 mL/hr at 06/05/12 0500  . lactated ringers 50 mL/hr at 06/05/12 0901    Principal Problem:  *Cholelithiasis Active Problems:  Coronary atherosclerosis of native coronary artery  Hypertension  Hyperlipidemia  UTI (lower urinary tract infection)    Time spent: 35 minutes    [facility]  [facility] Pager [phone]. If 8PM-8AM, please contact night-coverage at [email or web address], password [facility] 06/05/2012, 11:22 AM  LOS: 2 days

## 2012-06-05 NOTE — Preoperative (Signed)
Beta Blockers   Reason not to administer Beta Blockers:Not Applicable 

## 2012-06-05 NOTE — Transfer of Care (Signed)
Immediate Anesthesia Transfer of Care Note  Patient: Steve Wiggins  Procedure(s) Performed: Procedure(s) (LRB) with comments: LAPAROSCOPIC CHOLECYSTECTOMY (N/A) HERNIA REPAIR UMBILICAL ADULT (N/A) - Primary Umbilical Hernia Repair  Patient Location: PACU  Anesthesia Type:General  Level of Consciousness: awake, alert  and oriented  Airway & Oxygen Therapy: Patient Spontanous Breathing and Patient connected to face mask oxygen  Post-op Assessment: Report given to PACU RN and Patient moving all extremities X 4  Post vital signs: Reviewed and stable  Complications: No apparent anesthesia complications

## 2012-06-05 NOTE — Op Note (Signed)
Preoperative diagnosis: Acute cholecystitis Postoperative diagnosis: SAA Procedure: Laparoscopic cholecystectomy. Primary umbilical hernia repair Surgeon: Dr Harden Mo Asst: Dr. Violeta Gelinas Anesthesia: GETA Specimen: gb and contents to pathology EBL: 50 cc Drains: None Sponge and needle count correct at end of operation Disposition to recovery stable  Indications: This is 71 year old male with a long-standing history of right upper quadrant pain. This has progressed. He now has unrelenting pain. He has gallstones and a clinical exam consistent consistent with acute cholecystitis. I discussed with him a laparoscopic cholecystectomy the risks and benefits of the procedure.  Procedure: After informed consent was obtained the patient was taken to the operating room. He was on a therapeutic antibiotic regimen on the floor. Sequential compression devices were on his legs. He was then placed under general endotracheal anesthesia without complication. His abdomen was prepped and draped in the standard sterile surgical fashion. Surgical timeout was performed.  I made a curvilinear incision below his umbilicus after infiltrating Marcaine. I then encircled his umbilical hernia stalk. I divided this with cautery. I then entered into his peritoneum with my finger. I placed a 0 Vicryl pursestring suture through the fascia that was his umbilical hernia. I then placed a Hassan trocar through the hernia. I then insufflated the abdomen to 15 mm mercury pressure. Due to his body habitus it was very difficult to see anywhere. I did end up using a 10 mm 45 scope just to be able to visualize the gallbladder. I inserted 3 further 5 mm trocars in the epigastrium and right upper quadrant after infiltration with local anesthetic without complication. I retracted the gallbladder cephalad. This was noted to be tense and distended. There was pus inside of his gallbladder. I evacuated the gallbladder contents as much  I  could do so I could grasp this. Once I was able to do this I retracted it cephalad. With some difficulty I was eventually able to obtain a critical view of safety in the triangle. I was not going to do a cholangiogram due to the fact the cystic duct was short and there was a lot of inflammation. I very clearly saw my anatomy before I clipped both the artery and the duct and divided. The gallbladder was then removed from the liver bed with some difficulty. Once identified placed in an Endo Catch and removed from the umbilicus. I then obtained hemostasis. Irrigation was performed. I then removed the hasson trocar. I then closed the umbilical hernia with 2 more 0 Vicryl pursestring sutures. This completely obliterated the defect. I told them previously that this likely should be fixed with mesh but I would not do it at the same time out was taken and the gallbladder. There is certainly a chance of a recurrence of this hernia. I then remained remove the remaining trocars and desufflated the abdomen. I closed these with 4-0 Monocryl in Dermabond. I closed his umbilicus with 3-0 undyed Vicryl and Steri-Strips. He tolerated this well was extubated and transferred to recovery stable.

## 2012-06-05 NOTE — Progress Notes (Signed)
Patient Name: Steve Wiggins Date of Encounter: 06/05/2012   Principal Problem:  *Cholelithiasis Active Problems:  Coronary atherosclerosis of native coronary artery  Hypertension  Hyperlipidemia    SUBJECTIVE  71 year old male admitted with a history of CAD (PCI to OM 08/2010) who was admitted with cholelithiasis and is planned for a lap chole this morning. Early this morning felt hot and became diaphoretic. His temp was 103 F. Tylenol successfully relieved these symptoms. Blood and urine cultures were sent. He did not have chest pain, increased SOB, or nausea during that episode, or since we last saw him.  He currently complains of 3/10 RUQ pain that is well managed, medically. He voices no other complaints.  CURRENT MEDS    . allopurinol  100 mg Oral Daily  . amLODipine  5 mg Oral q morning - 10a  . aspirin  81 mg Oral Daily  . carvedilol  25 mg Oral BID WC  .  ceFAZolin (ANCEF) IV  2 g Intravenous On Call to OR  . chlorhexidine  1 application Topical Once  . [COMPLETED] chlorhexidine  1 application Topical Once  . fenofibrate  160 mg Oral Daily  . heparin  5,000 Units Subcutaneous Q8H  . insulin aspart  0-15 Units Subcutaneous Q4H  . lisinopril  40 mg Oral q morning - 10a  . pantoprazole  80 mg Oral Daily  . pneumococcal 23 valent vaccine  0.5 mL Intramuscular Tomorrow-1000  . simvastatin  20 mg Oral QPM    OBJECTIVE  Filed Vitals:   06/04/12 1609 06/04/12 2129 06/05/12 0538 06/05/12 0617  BP: 165/75 131/74 163/70 152/82  Pulse: 72 70 81   Temp: 97.9 F (36.6 C) 99.1 F (37.3 C) 101.3 F (38.5 C)   TempSrc: Oral Oral Oral   Resp: 18 20 18    Height:      Weight:      SpO2: 95% 91% 90%     Intake/Output Summary (Last 24 hours) at 06/05/12 0812 Last data filed at 06/05/12 0803  Gross per 24 hour  Intake   2875 ml  Output   1051 ml  Net   1824 ml   Filed Weights   06/04/12 0300  Weight: 221 lb 9 oz (100.5 kg)    PHYSICAL EXAM  General: Pleasant,  c/o mild pain. Neuro: Alert and oriented X 3. Moves all extremities spontaneously. Psych: Normal affect. HEENT:  Normal  Neck: Supple without bruits or JVD. Lungs:  Resp regular and unlabored, CTA. Heart: RRR no s3, s4, or murmurs. Abdomen: Soft, RUQ tenderness, protuberant, non-distended, BS + x 4.  Extremities: No clubbing, cyanosis or edema. DP/PT/Radials 2+ and equal bilaterally.  Accessory Clinical Findings  CBC  Basename 06/03/12 2318  WBC 12.3*  NEUTROABS 9.1*  HGB 13.7  HCT 38.8*  MCV 88.0  PLT 189   Basic Metabolic Panel  Basename 06/03/12 2318  NA 135  K 4.1  CL 97  CO2 23  GLUCOSE 167*  BUN 23  CREATININE 1.05  CALCIUM 9.8  MG --  PHOS --   Liver Function Tests  Basename 06/03/12 2318  AST 21  ALT 20  ALKPHOS 47  BILITOT 0.5  PROT 6.9  ALBUMIN 3.8    Basename 06/03/12 2318  LIPASE 17  AMYLASE --   ECG  (06/03/12) Sinus brady rate 49. No acute St/T changes. Similar to old ECG  Radiology/Studies  US Abdomen Limited  06/04/2012  *RADIOLOGY REPORT*  Clinical Data:  Abdominal pain, nausea,  vomiting.  LIMITED ABDOMINAL ULTRASOUND - RIGHT UPPER QUADRANT  Comparison:  05/30/2012  Findings:  Gallbladder:  Multiple small mobile gallstones as well as sludge within the gallbladder.  No gallbladder wall thickening.  Negative sonographic Murphy's.  Appearance similar to recent study.  Common bile duct:  Normal caliber, 6 mm.  Liver:  Heterogeneous, increased echotexture, more appreciable on today's study.  This could represent fatty infiltration. Hypoechoic area adjacent to the gallbladder fossa could reflect focal fatty sparing.  IMPRESSION: Cholelithiasis and gallbladder sludge.  No evidence of acute cholecystitis.  Suspect fatty infiltration of the liver.                    Original Report Authenticated By: Charlett Nose, M.D.    Dg Chest Portable 1 View  06/04/2012  *RADIOLOGY REPORT*  Clinical Data: Right-sided pain  PORTABLE CHEST - 1 VIEW  Comparison:  05/30/2012  Findings: The heart size and mediastinal contours are within normal limits.  Both lungs are clear.  The visualized skeletal structures are unremarkable.  IMPRESSION: Negative exam.   Original Report Authenticated By: Signa Kell, M.D.     ASSESSMENT AND PLAN  1. CAD: Patient has been stable and had no s/s of ischemia. His plan of care has not changed from a cardiology standpoint. No additional cardiac evaluation necessary at this time. We will follow along. Cont Bb, statin, asa (if feasible).   2. Cholelithiasis: Management per surgery.   3. HTN: BP has remained elevated during this admission. Patient is still experiencing mild pain. Reevaluate once patient is pain free and/or after surgical intervention.   4. DM: Management per Medicine.   Signed, Nicolasa Ducking NP  Came to see patient but he is in the OR. I will see him tomorrow.  Tonny Bollman 06/05/2012 10:19 AM

## 2012-06-05 NOTE — Progress Notes (Signed)
RT Note: RT called by RN for Respiratory Protocol. RT assessed pt. No neb treatments needed. Pt has no respiratory history and does not take respiratory meds at home. CRX clear, no wheezing or SOB.

## 2012-06-05 NOTE — Progress Notes (Signed)
Pt has an abdominal girth of 50 Inches around at the beginning of the shift.

## 2012-06-05 NOTE — Anesthesia Procedure Notes (Signed)
Procedure Name: Intubation Date/Time: 06/05/2012 11:57 AM Performed by: Rogelia Boga Pre-anesthesia Checklist: Patient identified, Emergency Drugs available, Suction available, Patient being monitored and Timeout performed Patient Re-evaluated:Patient Re-evaluated prior to inductionOxygen Delivery Method: Circle system utilized Preoxygenation: Pre-oxygenation with 100% oxygen Intubation Type: IV induction Ventilation: Mask ventilation without difficulty and Oral airway inserted - appropriate to patient size Tube type: Oral Tube size: 7.5 mm Number of attempts: 1 Airway Equipment and Method: Video-laryngoscopy Placement Confirmation: ETT inserted through vocal cords under direct vision,  positive ETCO2 and breath sounds checked- equal and bilateral Secured at: 22 cm Tube secured with: Tape Dental Injury: Teeth and Oropharynx as per pre-operative assessment

## 2012-06-06 ENCOUNTER — Encounter (HOSPITAL_COMMUNITY): Payer: Self-pay | Admitting: General Surgery

## 2012-06-06 DIAGNOSIS — K810 Acute cholecystitis: Secondary | ICD-10-CM

## 2012-06-06 LAB — CBC
HCT: 34.3 % — ABNORMAL LOW (ref 39.0–52.0)
MCH: 31.2 pg (ref 26.0–34.0)
MCHC: 34.7 g/dL (ref 30.0–36.0)
MCV: 89.8 fL (ref 78.0–100.0)
RDW: 13.2 % (ref 11.5–15.5)

## 2012-06-06 LAB — GLUCOSE, CAPILLARY
Glucose-Capillary: 150 mg/dL — ABNORMAL HIGH (ref 70–99)
Glucose-Capillary: 180 mg/dL — ABNORMAL HIGH (ref 70–99)
Glucose-Capillary: 204 mg/dL — ABNORMAL HIGH (ref 70–99)

## 2012-06-06 LAB — BASIC METABOLIC PANEL
BUN: 13 mg/dL (ref 6–23)
Chloride: 100 mEq/L (ref 96–112)
Creatinine, Ser: 1.02 mg/dL (ref 0.50–1.35)
GFR calc Af Amer: 85 mL/min — ABNORMAL LOW (ref >90)
Glucose, Bld: 154 mg/dL — ABNORMAL HIGH (ref 70–99)

## 2012-06-06 LAB — URINE CULTURE

## 2012-06-06 MED ORDER — PIPERACILLIN-TAZOBACTAM 3.375 G IVPB
3.3750 g | Freq: Three times a day (TID) | INTRAVENOUS | Status: DC
  Administered 2012-06-06 – 2012-06-09 (×10): 3.375 g via INTRAVENOUS
  Filled 2012-06-06 (×12): qty 50

## 2012-06-06 MED ORDER — GUAIFENESIN ER 600 MG PO TB12
1200.0000 mg | ORAL_TABLET | Freq: Two times a day (BID) | ORAL | Status: DC
  Administered 2012-06-06 – 2012-06-09 (×7): 1200 mg via ORAL
  Filled 2012-06-06 (×8): qty 2

## 2012-06-06 MED ORDER — POTASSIUM CHLORIDE CRYS ER 20 MEQ PO TBCR
40.0000 meq | EXTENDED_RELEASE_TABLET | Freq: Every day | ORAL | Status: DC
  Administered 2012-06-06 – 2012-06-09 (×4): 40 meq via ORAL
  Filled 2012-06-06 (×4): qty 2

## 2012-06-06 MED ORDER — POTASSIUM CHLORIDE IN NACL 20-0.45 MEQ/L-% IV SOLN
INTRAVENOUS | Status: DC
  Administered 2012-06-06 – 2012-06-07 (×3): via INTRAVENOUS
  Filled 2012-06-06 (×5): qty 1000

## 2012-06-06 MED ORDER — ACETAMINOPHEN 325 MG PO TABS: 650.0000 mg | ORAL_TABLET | Freq: Once | ORAL | Status: DC

## 2012-06-06 MED ORDER — ALBUTEROL SULFATE (5 MG/ML) 0.5% IN NEBU
2.5000 mg | INHALATION_SOLUTION | RESPIRATORY_TRACT | Status: DC | PRN
  Administered 2012-06-07: 2.5 mg via RESPIRATORY_TRACT
  Filled 2012-06-06: qty 0.5

## 2012-06-06 NOTE — Progress Notes (Signed)
Patient ID: Steve Wiggins, male   DOB: [birth year], 71 y.o.   MRN: [id number] Stable post op. K is 3.2. Will replete.

## 2012-06-06 NOTE — Progress Notes (Signed)
Patient ID: Steve Wiggins, male   DOB: [birth year], 71 y.o.   MRN: [id number] 1 Day Post-Op  Subjective: Very short of breath after being up moving, also wheezing, abd sore.  Denies n/v.  Tolerated clears this am.  Objective: Vital signs in last 24 hours: Temp:  [97.4 F (36.3 C)-100.4 F (38 C)] 99.9 F (37.7 C) (11/15 0618) Pulse Rate:  [70-82] 71  (11/15 0618) Resp:  [10-31] 24  (11/15 0618) BP: (126-181)/(68-112) 126/70 mmHg (11/15 0618) SpO2:  [89 %-96 %] 94 % (11/15 0618) FiO2 (%):  [3 %] 3 % (11/14 2153) Last BM Date: 06/03/12  Intake/Output from previous day: 11/14 0701 - 11/15 0700 In: 2126 [P.O.:726; I.V.:1400] Out: 300 [Urine:300] Intake/Output this shift:    PE: Abd: mildly distended, appropriate tenderness, +bowel sounds Lungs: audible wheezing, relatively clear breath sounds although effort is reduced.  Lab Results:   Basename 06/06/12 0515 06/03/12 2318  WBC 10.3 12.3*  HGB 11.9* 13.7  HCT 34.3* 38.8*  PLT 141* 189   BMET  Basename 06/06/12 0515 06/03/12 2318  NA 134* 135  K 3.2* 4.1  CL 100 97  CO2 25 23  GLUCOSE 154* 167*  BUN 13 23  CREATININE 1.02 1.05  CALCIUM 8.1* 9.8   PT/INR No results found for this basename: LABPROT:2,INR:2 in the last 72 hours CMP     Component Value Date/Time   NA 134* 06/06/2012 0515   K 3.2* 06/06/2012 0515   CL 100 06/06/2012 0515   CO2 25 06/06/2012 0515   GLUCOSE 154* 06/06/2012 0515   BUN 13 06/06/2012 0515   CREATININE 1.02 06/06/2012 0515   CALCIUM 8.1* 06/06/2012 0515   PROT 6.9 06/03/2012 2318   ALBUMIN 3.8 06/03/2012 2318   AST 21 06/03/2012 2318   ALT 20 06/03/2012 2318   ALKPHOS 47 06/03/2012 2318   BILITOT 0.5 06/03/2012 2318   GFRNONAA 73* 06/06/2012 0515   GFRAA 85* 06/06/2012 0515   Lipase     Component Value Date/Time   LIPASE 17 06/03/2012 2318       Studies/Results: No results found.  Anti-infectives: Anti-infectives     Start     Dose/Rate Route Frequency Ordered  Stop   06/06/12 1000  piperacillin-tazobactam (ZOSYN) IVPB 3.375 g       3.375 g 12.5 mL/hr over 240 Minutes Intravenous 3 times per day 06/06/12 0924     06/05/12 1200   cefTRIAXone (ROCEPHIN) 1 g in dextrose 5 % 50 mL IVPB  Status:  Discontinued        1 g 100 mL/hr over 30 Minutes Intravenous Every 24 hours 06/05/12 1128 06/06/12 0924   06/05/12 0600   ceFAZolin (ANCEF) IVPB 2 g/50 mL premix  Status:  Discontinued        2 g 100 mL/hr over 30 Minutes Intravenous On call to O.R. 06/04/12 1429 06/05/12 1444           Assessment/Plan 1. POD#1-Lap cholecystectomy: gangrenous gallbladder, will need IV abx for another 24-48 hours.  May develop ileus even though procedure lap.  Advance diet slowly.  Wheezing this am and short of breath- encourage IS and breathing treatments, aggressive pulm toilet.     LOS: 3 days    Tempest Frankland 06/06/2012

## 2012-06-06 NOTE — Progress Notes (Signed)
Post void bladder scan done as ordered by MD,result 150 cc.

## 2012-06-06 NOTE — Progress Notes (Signed)
Agree with above 

## 2012-06-06 NOTE — Progress Notes (Signed)
Seen at 7:50 Am [facility] PROGRESS NOTE  BOSTON COOKSON ZOX:[id number] DOB: 07-30-1940 DOA: 06/03/2012 PCP: [name], MD  HPI/Subjective: RUQ and right flank pain   Assessment/Plan:  Gangrenous cholecystitis -Status post laparoscopic cholecystectomy on 06/05/2012. -Antibiotics switched to Zosyn, and diet to be advanced slowly. -General surgery following.  UTI -Patient had fever of 101.5 last night, urinalysis consistent with UTI. -Urine culture and blood culture were sent. Patient will be started on Rocephin. York Spaniel he has history of recurrent UTIs which was treated successfully with ciprofloxacin.   Diabetes mellitus type 2  -Patient home oral hypoglycemic agents on hold. -SSI was in the hospital, resume his oral meds on discharge.  Hypertension and hyperlipidemia -Stable, cardiology evaluated the patient for cardiac risk stratification before surgery. -Continue home medications.   Code Status: Full code  Family Communication:  Disposition Plan:    Procedures:  Laparoscopic cholecystectomy scheduled for 06/05/2012   Antibiotics:  Saphenous start 06/05/2012    Objective: Filed Vitals:   06/05/12 2153 06/06/12 0214 06/06/12 0618 06/06/12 1113  BP: 135/69 151/77 126/70 147/75  Pulse: 82 78 71   Temp: 100.2 F (37.9 C) 100.4 F (38 C) 99.9 F (37.7 C)   TempSrc: Oral Oral Oral   Resp: 21 22 24    Height:      Weight:      SpO2: 93% 93% 94%     Intake/Output Summary (Last 24 hours) at 06/06/12 1119 Last data filed at 06/06/12 0945  Gross per 24 hour  Intake   2126 ml  Output    100 ml  Net   2026 ml   Filed Weights   06/04/12 0300  Weight: 100.5 kg (221 lb 9 oz)    Exam:  General: Alert and awake, oriented x3, not in any acute distress. HEENT: anicteric sclera, pupils reactive to light and accommodation, EOMI CVS: S1-S2 clear, no murmur rubs or gallops Chest: clear to auscultation bilaterally, no wheezing, rales or  rhonchi Abdomen: soft nontender, nondistended, normal bowel sounds, no organomegaly Extremities: no cyanosis, clubbing or edema noted bilaterally Neuro: Cranial nerves II-XII intact, no focal neurological deficits  Data Reviewed: Basic Metabolic Panel:  Lab 06/06/12 8119 06/03/12 2318  NA 134* 135  K 3.2* 4.1  CL 100 97  CO2 25 23  GLUCOSE 154* 167*  BUN 13 23  CREATININE 1.02 1.05  CALCIUM 8.1* 9.8  MG -- --  PHOS -- --   Liver Function Tests:  Lab 06/03/12 2318  AST 21  ALT 20  ALKPHOS 47  BILITOT 0.5  PROT 6.9  ALBUMIN 3.8    Lab 06/03/12 2318  LIPASE 17  AMYLASE --   No results found for this basename: AMMONIA:5 in the last 168 hours CBC:  Lab 06/06/12 0515 06/03/12 2318  WBC 10.3 12.3*  NEUTROABS -- 9.1*  HGB 11.9* 13.7  HCT 34.3* 38.8*  MCV 89.8 88.0  PLT 141* 189   Cardiac Enzymes: No results found for this basename: CKTOTAL:5,CKMB:5,CKMBINDEX:5,TROPONINI:5 in the last 168 hours BNP (last 3 results) No results found for this basename: PROBNP:3 in the last 8760 hours CBG:  Lab 06/06/12 0759 06/06/12 0409 06/06/12 0002 06/05/12 2010 06/05/12 1708  GLUCAP 148* 150* 140* 137* 178*    Recent Results (from the past 240 hour(s))  CULTURE, BLOOD (ROUTINE X 2)     Status: Normal (Preliminary result)   Collection Time   06/05/12  7:00 AM      Component Value Range Status Comment   Specimen  Description BLOOD RIGHT ARM   Final    Special Requests BOTTLES DRAWN AEROBIC ONLY 5CC   Final    Culture  Setup Time 06/05/2012 11:42   Final    Culture     Final    Value:        BLOOD CULTURE RECEIVED NO GROWTH TO DATE CULTURE WILL BE HELD FOR 5 DAYS BEFORE ISSUING A FINAL NEGATIVE REPORT   Report Status PENDING   Incomplete   CULTURE, BLOOD (ROUTINE X 2)     Status: Normal (Preliminary result)   Collection Time   06/05/12  7:10 AM      Component Value Range Status Comment   Specimen Description BLOOD RIGHT HAND   Final    Special Requests BOTTLES DRAWN  AEROBIC ONLY 5CC   Final    Culture  Setup Time 06/05/2012 11:42   Final    Culture     Final    Value:        BLOOD CULTURE RECEIVED NO GROWTH TO DATE CULTURE WILL BE HELD FOR 5 DAYS BEFORE ISSUING A FINAL NEGATIVE REPORT   Report Status PENDING   Incomplete   MRSA PCR SCREENING     Status: Normal   Collection Time   06/05/12  8:46 AM      Component Value Range Status Comment   MRSA by PCR NEGATIVE  NEGATIVE Final      Studies: No results found.  Scheduled Meds:    . acetaminophen  650 mg Oral Once  . allopurinol  100 mg Oral Daily  . amLODipine  5 mg Oral q morning - 10a  . aspirin  81 mg Oral Daily  . carvedilol  25 mg Oral BID WC  . fenofibrate  160 mg Oral Daily  . heparin  5,000 Units Subcutaneous Q8H  . insulin aspart  0-15 Units Subcutaneous Q4H  . pantoprazole  80 mg Oral Daily  . piperacillin-tazobactam (ZOSYN)  IV  3.375 g Intravenous Q8H  . [COMPLETED] pneumococcal 23 valent vaccine  0.5 mL Intramuscular Tomorrow-1000  . potassium chloride  40 mEq Oral Daily  . simvastatin  20 mg Oral QPM  . [DISCONTINUED]  ceFAZolin (ANCEF) IV  2 g Intravenous On Call to OR  . [DISCONTINUED] cefTRIAXone (ROCEPHIN)  IV  1 g Intravenous Q24H  . [DISCONTINUED] Chlorhexidine Gluconate Cloth  6 each Topical Q0600  . [DISCONTINUED] lisinopril  40 mg Oral q morning - 10a   Continuous Infusions:    . 0.45 % NaCl with KCl 20 mEq / L 100 mL/hr at 06/06/12 1052  . [DISCONTINUED] sodium chloride 125 mL/hr at 06/05/12 0500  . [DISCONTINUED] sodium chloride    . [DISCONTINUED] lactated ringers 50 mL/hr at 06/05/12 0901    Principal Problem:  *Cholelithiasis Active Problems:  Coronary atherosclerosis of native coronary artery  Hypertension  Hyperlipidemia  UTI (lower urinary tract infection)    Time spent: 35 minutes    [facility] A  [facility] Pager [phone]. If 8PM-8AM, please contact night-coverage at [email or web address], password [facility] 06/06/2012, 11:19 AM  LOS: 3  days

## 2012-06-07 ENCOUNTER — Inpatient Hospital Stay (HOSPITAL_COMMUNITY): Payer: Medicare Other

## 2012-06-07 LAB — GLUCOSE, CAPILLARY
Glucose-Capillary: 139 mg/dL — ABNORMAL HIGH (ref 70–99)
Glucose-Capillary: 186 mg/dL — ABNORMAL HIGH (ref 70–99)
Glucose-Capillary: 189 mg/dL — ABNORMAL HIGH (ref 70–99)
Glucose-Capillary: 175 mg/dL — ABNORMAL HIGH (ref 70–99)

## 2012-06-07 LAB — BASIC METABOLIC PANEL
Calcium: 7.7 mg/dL — ABNORMAL LOW (ref 8.4–10.5)
Chloride: 102 mEq/L (ref 96–112)
Creatinine, Ser: 1.17 mg/dL (ref 0.50–1.35)
GFR calc Af Amer: 72 mL/min — ABNORMAL LOW (ref >90)

## 2012-06-07 LAB — CBC
Platelets: 130 10*3/uL — ABNORMAL LOW (ref 150–400)
RDW: 13.1 % (ref 11.5–15.5)
WBC: 9.9 10*3/uL (ref 4.0–10.5)

## 2012-06-07 MED ORDER — POLYETHYLENE GLYCOL 3350 17 G PO PACK
17.0000 g | PACK | Freq: Every day | ORAL | Status: DC
  Administered 2012-06-07 – 2012-06-09 (×3): 17 g via ORAL
  Filled 2012-06-07 (×4): qty 1

## 2012-06-07 MED ORDER — SODIUM CHLORIDE 0.9 % IV BOLUS (SEPSIS)
500.0000 mL | Freq: Once | INTRAVENOUS | Status: AC
  Administered 2012-06-07: 500 mL via INTRAVENOUS

## 2012-06-07 NOTE — Progress Notes (Signed)
Seen at 7:50 Am [facility] PROGRESS NOTE  Steve Wiggins AVW:[id number] DOB: [birth year] DOA: 06/03/2012 PCP: [name], MD  HPI/Subjective: Afebrile, denies nausea or vomiting, constipated but she passes flatus.   Assessment/Plan:  Gangrenous cholecystitis -Status post laparoscopic cholecystectomy on 06/05/2012. -Antibiotics switched to Zosyn, and diet to be advanced slowly. -No bowel movements since the 11th, MiraLax added, he passes flatus frequently. -Ambulate in the hall, anticipate discharge in a.m. (if it's okay with Gen surgery) with Augmentin for 5 more days.  UTI -Patient had fever of 101.5 last night, urinalysis consistent with UTI. -Urine culture and blood culture were sent. Patient will be started on Rocephin. York Spaniel he has history of recurrent UTIs which was treated successfully with ciprofloxacin.   Diabetes mellitus type 2  -Patient home oral hypoglycemic agents on hold. -SSI was in the hospital, resume his oral meds on discharge.  Hypertension and hyperlipidemia -Stable, cardiology evaluated the patient for cardiac risk stratification before surgery. -Continue home medications.   Code Status: Full code  Family Communication:  Disposition Plan:    Procedures:  Laparoscopic cholecystectomy scheduled for 06/05/2012   Antibiotics:  Saphenous start 06/05/2012    Objective: Filed Vitals:   06/06/12 1113 06/06/12 1305 06/06/12 2044 06/07/12 0521  BP: 147/75 103/63 101/71 114/63  Pulse:  71 72 72  Temp:  97.9 F (36.6 C) 98.5 F (36.9 C) 99 F (37.2 C)  TempSrc:  Oral Oral Oral  Resp:  20 18   Height:      Weight:      SpO2:  95% 96% 92%    Intake/Output Summary (Last 24 hours) at 06/07/12 1049 Last data filed at 06/07/12 0900  Gross per 24 hour  Intake 2573.33 ml  Output    300 ml  Net 2273.33 ml   Filed Weights   06/04/12 0300  Weight: 100.5 kg (221 lb 9 oz)    Exam:  General: Alert and awake, oriented x3, not in any  acute distress. HEENT: anicteric sclera, pupils reactive to light and accommodation, EOMI CVS: S1-S2 clear, no murmur rubs or gallops Chest: clear to auscultation bilaterally, no wheezing, rales or rhonchi Abdomen: soft nontender, nondistended, normal bowel sounds, no organomegaly Extremities: no cyanosis, clubbing or edema noted bilaterally Neuro: Cranial nerves II-XII intact, no focal neurological deficits  Data Reviewed: Basic Metabolic Panel:  Lab 06/07/12 8295 06/06/12 0515 06/03/12 2318  NA 134* 134* 135  K 3.5 3.2* 4.1  CL 102 100 97  CO2 24 25 23   GLUCOSE 144* 154* 167*  BUN 19 13 23   CREATININE 1.17 1.02 1.05  CALCIUM 7.7* 8.1* 9.8  MG -- -- --  PHOS -- -- --   Liver Function Tests:  Lab 06/03/12 2318  AST 21  ALT 20  ALKPHOS 47  BILITOT 0.5  PROT 6.9  ALBUMIN 3.8    Lab 06/03/12 2318  LIPASE 17  AMYLASE --   No results found for this basename: AMMONIA:5 in the last 168 hours CBC:  Lab 06/07/12 0435 06/06/12 0515 06/03/12 2318  WBC 9.9 10.3 12.3*  NEUTROABS -- -- 9.1*  HGB 10.2* 11.9* 13.7  HCT 29.2* 34.3* 38.8*  MCV 91.0 89.8 88.0  PLT 130* 141* 189   Cardiac Enzymes: No results found for this basename: CKTOTAL:5,CKMB:5,CKMBINDEX:5,TROPONINI:5 in the last 168 hours BNP (last 3 results) No results found for this basename: PROBNP:3 in the last 8760 hours CBG:  Lab 06/07/12 0752 06/07/12 0331 06/06/12 2338 06/06/12 1954 06/06/12 1647  GLUCAP 139* 139*  150* 202* 204*    Recent Results (from the past 240 hour(s))  URINE CULTURE     Status: Normal   Collection Time   06/05/12  6:54 AM      Component Value Range Status Comment   Specimen Description URINE, CLEAN CATCH   Final    Special Requests NONE   Final    Culture  Setup Time 06/05/2012 13:54   Final    Colony Count 3,000 COLONIES/ML   Final    Culture INSIGNIFICANT GROWTH   Final    Report Status 06/06/2012 FINAL   Final   CULTURE, BLOOD (ROUTINE X 2)     Status: Normal (Preliminary  result)   Collection Time   06/05/12  7:00 AM      Component Value Range Status Comment   Specimen Description BLOOD RIGHT ARM   Final    Special Requests BOTTLES DRAWN AEROBIC ONLY 5CC   Final    Culture  Setup Time 06/05/2012 11:42   Final    Culture     Final    Value:        BLOOD CULTURE RECEIVED NO GROWTH TO DATE CULTURE WILL BE HELD FOR 5 DAYS BEFORE ISSUING A FINAL NEGATIVE REPORT   Report Status PENDING   Incomplete   CULTURE, BLOOD (ROUTINE X 2)     Status: Normal (Preliminary result)   Collection Time   06/05/12  7:10 AM      Component Value Range Status Comment   Specimen Description BLOOD RIGHT HAND   Final    Special Requests BOTTLES DRAWN AEROBIC ONLY 5CC   Final    Culture  Setup Time 06/05/2012 11:42   Final    Culture     Final    Value:        BLOOD CULTURE RECEIVED NO GROWTH TO DATE CULTURE WILL BE HELD FOR 5 DAYS BEFORE ISSUING A FINAL NEGATIVE REPORT   Report Status PENDING   Incomplete   MRSA PCR SCREENING     Status: Normal   Collection Time   06/05/12  8:46 AM      Component Value Range Status Comment   MRSA by PCR NEGATIVE  NEGATIVE Final      Studies: No results found.  Scheduled Meds:    . acetaminophen  650 mg Oral Once  . allopurinol  100 mg Oral Daily  . amLODipine  5 mg Oral q morning - 10a  . aspirin  81 mg Oral Daily  . carvedilol  25 mg Oral BID WC  . fenofibrate  160 mg Oral Daily  . guaiFENesin  1,200 mg Oral BID  . heparin  5,000 Units Subcutaneous Q8H  . insulin aspart  0-15 Units Subcutaneous Q4H  . pantoprazole  80 mg Oral Daily  . piperacillin-tazobactam (ZOSYN)  IV  3.375 g Intravenous Q8H  . polyethylene glycol  17 g Oral Daily  . potassium chloride  40 mEq Oral Daily  . simvastatin  20 mg Oral QPM   Continuous Infusions:    . 0.45 % NaCl with KCl 20 mEq / L 75 mL/hr at 06/07/12 1028    Principal Problem:  *Gangrenous cholecystitis Active Problems:  Coronary atherosclerosis of native coronary artery  Hypertension   Hyperlipidemia  UTI (lower urinary tract infection)    Time spent: 35 minutes    [facility] A  [facility] Pager [phone]. If 8PM-8AM, please contact night-coverage at [email or web address], password [facility] 06/07/2012, 10:49 AM  LOS: 4 days

## 2012-06-07 NOTE — Progress Notes (Signed)
2 Days Post-Op  Subjective: Passing some flatus but feels bloated, no n/v breathing better  Objective: Vital signs in last 24 hours: Temp:  [97.9 F (36.6 C)-99 F (37.2 C)] 99 F (37.2 C) (11/16 0521) Pulse Rate:  [71-72] 72  (11/16 0521) Resp:  [18-20] 18  (11/15 2044) BP: (101-147)/(63-75) 114/63 mmHg (11/16 0521) SpO2:  [92 %-96 %] 92 % (11/16 0521) Last BM Date: 06/03/12  Intake/Output from previous day: 11/15 0701 - 11/16 0700 In: 2226.7 [P.O.:240; I.V.:1836.7; IV Piggyback:150] Out: 400 [Urine:400] Intake/Output this shift: Total I/O In: 346.7 [P.O.:120; I.V.:226.7] Out: -   GI: mild distended wounds clean approp tender  Lab Results:   Basename 06/07/12 0435 06/06/12 0515  WBC 9.9 10.3  HGB 10.2* 11.9*  HCT 29.2* 34.3*  PLT 130* 141*   BMET  Basename 06/07/12 0435 06/06/12 0515  NA 134* 134*  K 3.5 3.2*  CL 102 100  CO2 24 25  GLUCOSE 144* 154*  BUN 19 13  CREATININE 1.17 1.02  CALCIUM 7.7* 8.1*   PT/INR No results found for this basename: LABPROT:2,INR:2 in the last 72 hours ABG No results found for this basename: PHART:2,PCO2:2,PO2:2,HCO3:2 in the last 72 hours  Studies/Results: No results found.  Anti-infectives: Anti-infectives     Start     Dose/Rate Route Frequency Ordered Stop   06/06/12 1000   piperacillin-tazobactam (ZOSYN) IVPB 3.375 g        3.375 g 12.5 mL/hr over 240 Minutes Intravenous 3 times per day 06/06/12 0924     06/05/12 1200   cefTRIAXone (ROCEPHIN) 1 g in dextrose 5 % 50 mL IVPB  Status:  Discontinued        1 g 100 mL/hr over 30 Minutes Intravenous Every 24 hours 06/05/12 1128 06/06/12 0924   06/05/12 0600   ceFAZolin (ANCEF) IVPB 2 g/50 mL premix  Status:  Discontinued        2 g 100 mL/hr over 30 Minutes Intravenous On call to O.R. 06/04/12 1429 06/05/12 1444          Assessment/Plan: POD 2 lap chole for gangrenous gb Po pain meds Keep at fulls today as has mild ileus Appreciate im assistance Cont iv abx,  will transition to 5 day course of orals if dc'd tomorrow Sq heparin scds   [facility] 06/07/2012

## 2012-06-07 NOTE — Progress Notes (Signed)
Pt got up from bed and walked to bathroom. RN gave pt incentive spirometer and encouraged him to use it. 750 is what he achieved.

## 2012-06-07 NOTE — Progress Notes (Signed)
PT is encouraged to get up from bed and to use his incentive spirometer qh while he's awake.

## 2012-06-08 LAB — GLUCOSE, CAPILLARY
Glucose-Capillary: 201 mg/dL — ABNORMAL HIGH (ref 70–99)
Glucose-Capillary: 193 mg/dL — ABNORMAL HIGH (ref 70–99)
Glucose-Capillary: 173 mg/dL — ABNORMAL HIGH (ref 70–99)

## 2012-06-08 LAB — BASIC METABOLIC PANEL
Calcium: 8.2 mg/dL — ABNORMAL LOW (ref 8.4–10.5)
Creatinine, Ser: 1.08 mg/dL (ref 0.50–1.35)
GFR calc Af Amer: 79 mL/min — ABNORMAL LOW (ref >90)
Sodium: 134 mEq/L — ABNORMAL LOW (ref 135–145)

## 2012-06-08 MED ORDER — FUROSEMIDE 10 MG/ML IJ SOLN
40.0000 mg | Freq: Once | INTRAMUSCULAR | Status: AC
  Administered 2012-06-08: 40 mg via INTRAVENOUS
  Filled 2012-06-08: qty 4

## 2012-06-08 MED ORDER — BISACODYL 10 MG RE SUPP
10.0000 mg | Freq: Once | RECTAL | Status: AC
  Administered 2012-06-08: 10 mg via RECTAL
  Filled 2012-06-08: qty 1

## 2012-06-08 NOTE — Progress Notes (Signed)
3 Days Post-Op  Subjective: Passing flatus and stool and feels bloated and is still nauseated. Sipping on liquids. Large man, slow to move and get out of bed.  Objective: Vital signs in last 24 hours: Temp:  [98.2 F (36.8 C)-99.8 F (37.7 C)] 98.2 F (36.8 C) (11/17 0429) Pulse Rate:  [66-71] 66  (11/17 0429) Resp:  [18] 18  (11/17 0429) BP: (114-123)/(57-67) 123/57 mmHg (11/17 0429) SpO2:  [94 %-96 %] 96 % (11/17 0429) Last BM Date: 06/03/12  Intake/Output from previous day: 11/16 0701 - 11/17 0700 In: 2207.1 [P.O.:1040; I.V.:1017.1; IV Piggyback:150] Out: 1145 [Urine:1145] Intake/Output this shift: Total I/O In: 600 [P.O.:600] Out: -   General appearance: awake, minimal distress, middle status normal, skin warm and dry, family in room. GI: abdomen is obese and distended and a little bit tympanitic. Minimal bowel sounds.  Soft,  minimal tenderness, appropriate  Lab Results:  Results for orders placed during the hospital encounter of 06/03/12 (from the past 24 hour(s))  GLUCOSE, CAPILLARY     Status: Abnormal   Collection Time   06/07/12  3:55 PM      Component Value Range   Glucose-Capillary 196 (*) 70 - 99 mg/dL  GLUCOSE, CAPILLARY     Status: Abnormal   Collection Time   06/07/12  4:04 PM      Component Value Range   Glucose-Capillary 189 (*) 70 - 99 mg/dL  GLUCOSE, CAPILLARY     Status: Abnormal   Collection Time   06/07/12  8:13 PM      Component Value Range   Glucose-Capillary 175 (*) 70 - 99 mg/dL   Comment 1 Notify RN    GLUCOSE, CAPILLARY     Status: Abnormal   Collection Time   06/07/12 11:53 PM      Component Value Range   Glucose-Capillary 145 (*) 70 - 99 mg/dL   Comment 1 Notify RN    GLUCOSE, CAPILLARY     Status: Abnormal   Collection Time   06/08/12  4:28 AM      Component Value Range   Glucose-Capillary 127 (*) 70 - 99 mg/dL   Comment 1 Notify RN    BASIC METABOLIC PANEL     Status: Abnormal   Collection Time   06/08/12  5:39 AM       Component Value Range   Sodium 134 (*) 135 - 145 mEq/L   Potassium 4.1  3.5 - 5.1 mEq/L   Chloride 101  96 - 112 mEq/L   CO2 24  19 - 32 mEq/L   Glucose, Bld 152 (*) 70 - 99 mg/dL   BUN 14  6 - 23 mg/dL   Creatinine, Ser 4.54  0.50 - 1.35 mg/dL   Calcium 8.2 (*) 8.4 - 10.5 mg/dL   GFR calc non Af Amer 69 (*) >90 mL/min   GFR calc Af Amer 79 (*) >90 mL/min  GLUCOSE, CAPILLARY     Status: Abnormal   Collection Time   06/08/12  7:51 AM      Component Value Range   Glucose-Capillary 137 (*) 70 - 99 mg/dL  GLUCOSE, CAPILLARY     Status: Abnormal   Collection Time   06/08/12 11:48 AM      Component Value Range   Glucose-Capillary 201 (*) 70 - 99 mg/dL     Studies/Results: @[id number] @     . acetaminophen  650 mg Oral Once  . allopurinol  100 mg Oral Daily  . amLODipine  5 mg  Oral q morning - 10a  . aspirin  81 mg Oral Daily  . [COMPLETED] bisacodyl  10 mg Rectal Once  . carvedilol  25 mg Oral BID WC  . fenofibrate  160 mg Oral Daily  . guaiFENesin  1,200 mg Oral BID  . heparin  5,000 Units Subcutaneous Q8H  . insulin aspart  0-15 Units Subcutaneous Q4H  . pantoprazole  80 mg Oral Daily  . piperacillin-tazobactam (ZOSYN)  IV  3.375 g Intravenous Q8H  . polyethylene glycol  17 g Oral Daily  . potassium chloride  40 mEq Oral Daily  . simvastatin  20 mg Oral QPM  . [COMPLETED] sodium chloride  500 mL Intravenous Once     Assessment/Plan: s/p Procedure(s): LAPAROSCOPIC CHOLECYSTECTOMY HERNIA REPAIR UMBILICAL ADULT   POD#3, laparoscopic cholecystectomy for gangrenous gallbladder. Still has significant ileus despite bowel movements Keep on full liquids today due to ileus IV narcotic discontinued, continue oxycodone PRN Continue IV antibiotics, will transition to five-day course of oral antibiotics once her ileus resolved. DVT prophylaxis, continue SCDs and subcutaneous heparin.    LOS: 5 days    Steve Wiggins M 06/08/2012  . .prob

## 2012-06-08 NOTE — Progress Notes (Signed)
Seen at 7:50 Am [facility] PROGRESS NOTE  Steve Wiggins ZOX:[id number] DOB: May 09, 1941 DOA: 06/03/2012 PCP: [name], MD  HPI/Subjective: Had bowel movement today, minimal nausea   Assessment/Plan:  Gangrenous cholecystitis -Status post laparoscopic cholecystectomy on 06/05/2012. -Antibiotics switched to Zosyn, and diet to be advanced slowly. -No bowel movements since the 11th, MiraLax added, he passes flatus frequently. -Ambulate in the hall, anticipate discharge in a.m. (if it's okay with Gen surgery). -Continue full liquids for now, hopefully can be discharged in the morning.  UTI -Patient had fever of 101.5 last night, urinalysis consistent with UTI. -Urine culture and blood culture were sent. Patient will be started on Rocephin. York Spaniel he has history of recurrent UTIs which was treated successfully with ciprofloxacin.   Diabetes mellitus type 2  -Patient home oral hypoglycemic agents on hold. -SSI was in the hospital, resume his oral meds on discharge.  Hypertension and hyperlipidemia -Stable, cardiology evaluated the patient for cardiac risk stratification before surgery. -Continue home medications.   Code Status: Full code  Family Communication:  Disposition Plan:    Procedures:  Laparoscopic cholecystectomy scheduled for 06/05/2012   Antibiotics:  Saphenous start 06/05/2012    Objective: Filed Vitals:   06/07/12 1339 06/07/12 1644 06/07/12 2016 06/08/12 0429  BP: 114/67  116/64 123/57  Pulse: 70  71 66  Temp: 98.7 F (37.1 C)  99.8 F (37.7 C) 98.2 F (36.8 C)  TempSrc: Oral  Oral Oral  Resp:   18 18  Height:      Weight:      SpO2: 94% 94% 94% 96%    Intake/Output Summary (Last 24 hours) at 06/08/12 1316 Last data filed at 06/08/12 0833  Gross per 24 hour  Intake 2210.42 ml  Output    770 ml  Net 1440.42 ml   Filed Weights   06/04/12 0300  Weight: 100.5 kg (221 lb 9 oz)    Exam:  General: Alert and awake, oriented  x3, not in any acute distress. HEENT: anicteric sclera, pupils reactive to light and accommodation, EOMI CVS: S1-S2 clear, no murmur rubs or gallops Chest: clear to auscultation bilaterally, no wheezing, rales or rhonchi Abdomen: soft nontender, nondistended, normal bowel sounds, no organomegaly Extremities: no cyanosis, clubbing or edema noted bilaterally Neuro: Cranial nerves II-XII intact, no focal neurological deficits  Data Reviewed: Basic Metabolic Panel:  Lab 06/08/12 8119 06/07/12 0435 06/06/12 0515 06/03/12 2318  NA 134* 134* 134* 135  K 4.1 3.5 3.2* 4.1  CL 101 102 100 97  CO2 24 24 25 23   GLUCOSE 152* 144* 154* 167*  BUN 14 19 13 23   CREATININE 1.08 1.17 1.02 1.05  CALCIUM 8.2* 7.7* 8.1* 9.8  MG -- -- -- --  PHOS -- -- -- --   Liver Function Tests:  Lab 06/03/12 2318  AST 21  ALT 20  ALKPHOS 47  BILITOT 0.5  PROT 6.9  ALBUMIN 3.8    Lab 06/03/12 2318  LIPASE 17  AMYLASE --   No results found for this basename: AMMONIA:5 in the last 168 hours CBC:  Lab 06/07/12 0435 06/06/12 0515 06/03/12 2318  WBC 9.9 10.3 12.3*  NEUTROABS -- -- 9.1*  HGB 10.2* 11.9* 13.7  HCT 29.2* 34.3* 38.8*  MCV 91.0 89.8 88.0  PLT 130* 141* 189   Cardiac Enzymes: No results found for this basename: CKTOTAL:5,CKMB:5,CKMBINDEX:5,TROPONINI:5 in the last 168 hours BNP (last 3 results) No results found for this basename: PROBNP:3 in the last 8760 hours CBG:  Lab  06/08/12 1148 06/08/12 0751 06/08/12 0428 06/07/12 2353 06/07/12 2013  GLUCAP 201* 137* 127* 145* 175*    Recent Results (from the past 240 hour(s))  URINE CULTURE     Status: Normal   Collection Time   06/05/12  6:54 AM      Component Value Range Status Comment   Specimen Description URINE, CLEAN CATCH   Final    Special Requests NONE   Final    Culture  Setup Time 06/05/2012 13:54   Final    Colony Count 3,000 COLONIES/ML   Final    Culture INSIGNIFICANT GROWTH   Final    Report Status 06/06/2012 FINAL   Final    CULTURE, BLOOD (ROUTINE X 2)     Status: Normal (Preliminary result)   Collection Time   06/05/12  7:00 AM      Component Value Range Status Comment   Specimen Description BLOOD RIGHT ARM   Final    Special Requests BOTTLES DRAWN AEROBIC ONLY 5CC   Final    Culture  Setup Time 06/05/2012 11:42   Final    Culture     Final    Value:        BLOOD CULTURE RECEIVED NO GROWTH TO DATE CULTURE WILL BE HELD FOR 5 DAYS BEFORE ISSUING A FINAL NEGATIVE REPORT   Report Status PENDING   Incomplete   CULTURE, BLOOD (ROUTINE X 2)     Status: Normal (Preliminary result)   Collection Time   06/05/12  7:10 AM      Component Value Range Status Comment   Specimen Description BLOOD RIGHT HAND   Final    Special Requests BOTTLES DRAWN AEROBIC ONLY 5CC   Final    Culture  Setup Time 06/05/2012 11:42   Final    Culture     Final    Value:        BLOOD CULTURE RECEIVED NO GROWTH TO DATE CULTURE WILL BE HELD FOR 5 DAYS BEFORE ISSUING A FINAL NEGATIVE REPORT   Report Status PENDING   Incomplete   MRSA PCR SCREENING     Status: Normal   Collection Time   06/05/12  8:46 AM      Component Value Range Status Comment   MRSA by PCR NEGATIVE  NEGATIVE Final      Studies: Dg Chest 2 View  06/07/2012  *RADIOLOGY REPORT*  Clinical Data: Short of breath.  CHEST - 2 VIEW  Comparison: 06/04/2012  Findings: There are new areas of discoid and reticular type opacity in the right upper lobe, both lung bases and extending from the left hilum.  This is all likely atelectasis.  Infiltrate is not excluded.  The lungs otherwise clear.  Mild elevation of the right hemidiaphragm.  The cardiac silhouette is normal in size.  No mediastinal or hilar mass or adenopathy.  IMPRESSION: New areas of lung opacity most suggestive of atelectasis. Infiltrate is possible but felt less likely.   Original Report Authenticated By: Amie Portland, M.D.     Scheduled Meds:    . acetaminophen  650 mg Oral Once  . allopurinol  100 mg Oral Daily  .  amLODipine  5 mg Oral q morning - 10a  . aspirin  81 mg Oral Daily  . [COMPLETED] bisacodyl  10 mg Rectal Once  . carvedilol  25 mg Oral BID WC  . fenofibrate  160 mg Oral Daily  . guaiFENesin  1,200 mg Oral BID  . heparin  5,000 Units Subcutaneous Q8H  .  insulin aspart  0-15 Units Subcutaneous Q4H  . pantoprazole  80 mg Oral Daily  . piperacillin-tazobactam (ZOSYN)  IV  3.375 g Intravenous Q8H  . polyethylene glycol  17 g Oral Daily  . potassium chloride  40 mEq Oral Daily  . simvastatin  20 mg Oral QPM  . [COMPLETED] sodium chloride  500 mL Intravenous Once   Continuous Infusions:    Principal Problem:  *Gangrenous cholecystitis Active Problems:  Coronary atherosclerosis of native coronary artery  Hypertension  Hyperlipidemia  UTI (lower urinary tract infection)    Time spent: 25 minutes    [facility] A  [facility] Pager [phone]. If 8PM-8AM, please contact night-coverage at [email or web address], password [facility] 06/08/2012, 1:16 PM  LOS: 5 days

## 2012-06-09 DIAGNOSIS — K810 Acute cholecystitis: Secondary | ICD-10-CM

## 2012-06-09 LAB — GLUCOSE, CAPILLARY
Glucose-Capillary: 137 mg/dL — ABNORMAL HIGH (ref 70–99)
Glucose-Capillary: 200 mg/dL — ABNORMAL HIGH (ref 70–99)

## 2012-06-09 LAB — BASIC METABOLIC PANEL
Chloride: 100 mEq/L (ref 96–112)
Creatinine, Ser: 1.09 mg/dL (ref 0.50–1.35)
GFR calc Af Amer: 79 mL/min — ABNORMAL LOW (ref >90)
Potassium: 3.5 mEq/L (ref 3.5–5.1)
Sodium: 135 mEq/L (ref 135–145)

## 2012-06-09 MED ORDER — OXYCODONE-ACETAMINOPHEN 5-325 MG PO TABS: 1.0000 | ORAL_TABLET | Freq: Four times a day (QID) | ORAL | Status: DC | PRN

## 2012-06-09 MED ORDER — POLYETHYLENE GLYCOL 3350 17 G PO PACK: 17.0000 g | PACK | Freq: Every day | ORAL | Status: DC

## 2012-06-09 MED ORDER — FUROSEMIDE 10 MG/ML IJ SOLN
40.0000 mg | Freq: Once | INTRAMUSCULAR | Status: AC
  Administered 2012-06-09: 40 mg via INTRAVENOUS
  Filled 2012-06-09: qty 4

## 2012-06-09 MED ORDER — AMOXICILLIN-POT CLAVULANATE 875-125 MG PO TABS: 1.0000 | ORAL_TABLET | Freq: Two times a day (BID) | ORAL | Status: DC

## 2012-06-09 NOTE — Progress Notes (Signed)
Patient ID: Steve Wiggins, male   DOB: May 20, 1941, 71 y.o.   MRN: [id number] 4 Days Post-Op  Subjective: Pt feeling better today.  Having BMs and not nauseated.  Tolerating full liquids  Objective: Vital signs in last 24 hours: Temp:  [97.8 F (36.6 C)-98.2 F (36.8 C)] 98 F (36.7 C) (11/18 0450) Pulse Rate:  [58-68] 60  (11/18 0450) Resp:  [18-20] 18  (11/18 0450) BP: (119-131)/(64-72) 131/64 mmHg (11/18 0450) SpO2:  [91 %-93 %] 91 % (11/18 0450) Last BM Date: 06/09/12  Intake/Output from previous day: 11/17 0701 - 11/18 0700 In: 1110 [P.O.:960; IV Piggyback:150] Out: 675 [Urine:675] Intake/Output this shift: Total I/O In: 360 [P.O.:360] Out: -   PE: Abd: soft, slight distention, patient states this is relatively normal, +BS, appropriately tender, incisions c/d/i  Lab Results:   Basename 06/07/12 0435  WBC 9.9  HGB 10.2*  HCT 29.2*  PLT 130*   BMET  Basename 06/09/12 0624 06/08/12 0539  NA 135 134*  K 3.5 4.1  CL 100 101  CO2 26 24  GLUCOSE 145* 152*  BUN 9 14  CREATININE 1.09 1.08  CALCIUM 8.0* 8.2*   PT/INR No results found for this basename: LABPROT:2,INR:2 in the last 72 hours CMP     Component Value Date/Time   NA 135 06/09/2012 0624   K 3.5 06/09/2012 0624   CL 100 06/09/2012 0624   CO2 26 06/09/2012 0624   GLUCOSE 145* 06/09/2012 0624   BUN 9 06/09/2012 0624   CREATININE 1.09 06/09/2012 0624   CALCIUM 8.0* 06/09/2012 0624   PROT 6.9 06/03/2012 2318   ALBUMIN 3.8 06/03/2012 2318   AST 21 06/03/2012 2318   ALT 20 06/03/2012 2318   ALKPHOS 47 06/03/2012 2318   BILITOT 0.5 06/03/2012 2318   GFRNONAA 68* 06/09/2012 0624   GFRAA 79* 06/09/2012 0624   Lipase     Component Value Date/Time   LIPASE 17 06/03/2012 2318       Studies/Results: No results found.  Anti-infectives: Anti-infectives     Start     Dose/Rate Route Frequency Ordered Stop   06/06/12 1000  piperacillin-tazobactam (ZOSYN) IVPB 3.375 g       3.375 g 12.5  mL/hr over 240 Minutes Intravenous 3 times per day 06/06/12 0924     06/05/12 1200   cefTRIAXone (ROCEPHIN) 1 g in dextrose 5 % 50 mL IVPB  Status:  Discontinued        1 g 100 mL/hr over 30 Minutes Intravenous Every 24 hours 06/05/12 1128 06/06/12 0924   06/05/12 0600   ceFAZolin (ANCEF) IVPB 2 g/50 mL premix  Status:  Discontinued        2 g 100 mL/hr over 30 Minutes Intravenous On call to O.R. 06/04/12 1429 06/05/12 1444           Assessment/Plan  1. S/p lap chole 2. Post op ileus  Plan: 1. Patient doing well today.  Agree with carb mod diet.  If tolerates this, then patient is surgically stable for dc home. 2. Follow up in 2-3 weeks 3. augmentin for 5 days as outpatient.   LOS: 6 days    Maeva Dant E 06/09/2012, 12:10 PM Pager: [phone]

## 2012-06-09 NOTE — Discharge Summary (Signed)
Physician Discharge Summary  Steve Wiggins ZOX:[id number] DOB: 09/07/1940 DOA: 06/03/2012  PCP: [name], MD  Admit date: 06/03/2012 Discharge date: 06/09/2012  Time spent: 40 minutes  Recommendations for Outpatient Follow-up:  1. Followup with [facility] in 1 week  Discharge Diagnoses:  Principal Problem:  *Gangrenous cholecystitis Active Problems:  Coronary atherosclerosis of native coronary artery  Hypertension  Hyperlipidemia  UTI (lower urinary tract infection)   Discharge Condition: Stable  Diet recommendation: Carbohydrate modified diet  Filed Weights   06/04/12 0300  Weight: 100.5 kg (221 lb 9 oz)    History of present illness:  Steve Wiggins is an 71 y.o. male with hx of CAD, s/p one cardiac stent placement about 2 years ago in [place], DM, Hx of recent diagnosis of symptomatic cholelithiasis, seen by Dr Carolynne Edouard, and planned for lap cholecystectomy after cardiac clearance, returned to ER because of increased RUQ pain after eating today. He has some nausea, but no vomiting, no fever, chills, black or bloody stool. No substernal CP, or shortness of breath. Since he had his cardiac stent, he denied having any chest pain with exertion. Evaluation in the ER include an EKG with no acute ST-T changes, mild leukocytosis with WBC of 13K, normal lipase, LFTs, renal fx tests, electrolytes, and initial cardiac marker. Hospitalist was asked to admit this patient for pain control, and for surgical consult concerning laparoscopic cholecystectomy.   Hospital Course:   1. Gangrenous cholecystitis: Patient admitted to the hospital because of RUQ pain, patient already has laparoscopic cholecystectomy scheduled for December 3, but because of intractable RUQ pain he came into the hospital for further evaluation. General surgery was consulted and laparoscopic cholecystectomy was done on 06/05/2012 by Dr. Dwain Sarna. He did show gangrenous cholecystitis. Patient  on this spectrum antibiotics for 3 days, on the day of discharge prescription of Augmentin for 5 more days was given. Per general surgery patient had mild postoperative ileus, but now he has good bowel  Sounds, he had 3 bowel movements so far and he is tolerating diet very well.  2. UTI: Patient had fever of 101.5, urinalysis was consistent with UTI. Patient initially started on Rocephin, after the cholecystectomy and intraoperative report showing gangrenous cholecystitis the antibiotics were switched to Zosyn. Patient said he had recurrent UTIs because of enlarged prostate. Patient also discharged on Augmentin.  3. Diabetes mellitus type 2: Patient is oral hypoglycemic medications at home, these were held for the time of admission. SSI and carbohydrate modified diet were utilized during this hospital stay.  4. Hypertension/hyperlipidemia: Stable patient has history of hyperlipidemia cardiology evaluated the patient for cardiac risk stratification for surgery and recommended surgery without any further intervention.  Procedures:  Laparoscopic cholecystectomy done on 06/05/2012 by Emelia Loron  Consultations:  [facility], Emelia Loron  Discharge Exam: Filed Vitals:   06/08/12 0429 06/08/12 1331 06/08/12 2028 06/09/12 0450  BP: 123/57 122/72 119/65 131/64  Pulse: 66 68 58 60  Temp: 98.2 F (36.8 C) 97.8 F (36.6 C) 98.2 F (36.8 C) 98 F (36.7 C)  TempSrc: Oral Oral Oral Oral  Resp: 18 20 18 18   Height:      Weight:      SpO2: 96% 93% 92% 91%   General: Alert and awake, oriented x3, not in any acute distress. HEENT: anicteric sclera, pupils reactive to light and accommodation, EOMI CVS: S1-S2 clear, no murmur rubs or gallops Chest: clear to auscultation bilaterally, no wheezing, rales or rhonchi Abdomen: soft nontender, nondistended,  normal bowel sounds, no organomegaly Extremities: no cyanosis, clubbing or edema noted bilaterally Neuro: Cranial nerves  II-XII intact, no focal neurological deficits  Discharge Instructions  Discharge Orders    Future Appointments: Provider: Department: Dept Phone: Center:   06/23/2012 10:00 AM [facility] Denn[facility] Bible [facility] SAME DAY SURGERY [phone] None   06/24/2012 3:15 PM [facility], [place] [phone] None   07/22/2012 9:50 AM Robyne Askew, MD [facility] Surgery, [place] [phone] None       Medication List     As of 06/09/2012 12:29 PM    TAKE these medications         allopurinol 100 MG tablet   Commonly known as: ZYLOPRIM   Take 100 mg by mouth daily.      amLODipine 5 MG tablet   Commonly known as: NORVASC   Take 5 mg by mouth every morning.      amoxicillin-clavulanate 875-125 MG per tablet   Commonly known as: AUGMENTIN   Take 1 tablet by mouth 2 (two) times daily.      aspirin 81 MG tablet   Take 81 mg by mouth every morning.      carvedilol 25 MG tablet   Commonly known as: COREG   Take 1 tablet (25 mg total) by mouth 2 (two) times daily.      diphenhydramine-acetaminophen 25-500 MG Tabs   Commonly known as: TYLENOL PM   Take 1 tablet by mouth at bedtime as needed. For sleep      fenofibrate 145 MG tablet   Commonly known as: TRICOR   Take 145 mg by mouth daily.      glimepiride 4 MG tablet   Commonly known as: AMARYL   Take 4 mg by mouth daily before breakfast.      hydrochlorothiazide 25 MG tablet   Commonly known as: HYDRODIURIL   Take 25 mg by mouth every morning.      lisinopril 20 MG tablet   Commonly known as: PRINIVIL,ZESTRIL   Take 40 mg by mouth every morning.      metFORMIN 500 MG tablet   Commonly known as: GLUCOPHAGE   Take 500 mg by mouth 3 (three) times daily.      omeprazole 40 MG capsule   Commonly known as: PRILOSEC   Take 40 mg by mouth every evening.      ondansetron 4 MG tablet   Commonly known as: ZOFRAN   Take 1 tablet (4 mg total) by mouth every 6 (six) hours.       oxyCODONE-acetaminophen 5-325 MG per tablet   Commonly known as: PERCOCET/ROXICET   Take 1-2 tablets by mouth every 6 (six) hours as needed for pain.      polyethylene glycol packet   Commonly known as: MIRALAX / GLYCOLAX   Take 17 g by mouth daily.      simvastatin 20 MG tablet   Commonly known as: ZOCOR   Take 20 mg by mouth every evening.           Follow-up Information    Follow up with [facility] On 06/24/2012. (3:15pm, arrive at 2:45pm)    Contact information:   [address]   [place] [address] [phone]           The results of significant diagnostics from this hospitalization (including imaging, microbiology, ancillary and laboratory) are listed below for reference.  Significant Diagnostic Studies: Dg Chest 2 View  06/07/2012  *RADIOLOGY REPORT*  Clinical Data: Short of breath.  CHEST - 2 VIEW  Comparison: 06/04/2012  Findings: There are new areas of discoid and reticular type opacity in the right upper lobe, both lung bases and extending from the left hilum.  This is all likely atelectasis.  Infiltrate is not excluded.  The lungs otherwise clear.  Mild elevation of the right hemidiaphragm.  The cardiac silhouette is normal in size.  No mediastinal or hilar mass or adenopathy.  IMPRESSION: New areas of lung opacity most suggestive of atelectasis. Infiltrate is possible but felt less likely.   Original Report Authenticated By: Amie Portland, M.D.    Dg Chest 2 View  05/30/2012  *RADIOLOGY REPORT*  Clinical Data: Right lower chest pain.  CHEST - 2 VIEW  Comparison: None available.  Findings: The heart size is normal.  The lungs are clear.  There is partial eventration of the anterior right hemidiaphragm, likely chronic finding.  Mild degenerative changes are noted in the thoracic spine.  IMPRESSION:  1.  No acute cardiopulmonary disease.   Original Report Authenticated By: Marin Roberts, M.D.    US Abdomen Complete  05/30/2012   *RADIOLOGY REPORT*  Clinical Data:  Right upper quadrant pain.  COMPLETE ABDOMINAL ULTRASOUND  Comparison:  None.  Findings:  Gallbladder:  Multiple small mobile gallstones within the gallbladder.  No wall thickening.  Negative sonographic Murphy's.  Common bile duct:   Normal caliber, 5 mm.  Liver:  No focal lesion identified.  Within normal limits in parenchymal echogenicity.  IVC:  Appears normal.  Pancreas:  No focal abnormality seen.  Spleen:  Within normal limits in size and echotexture.  Right Kidney:   Slight fullness of the renal collecting system. Normal echotexture.  No focal abnormality.  Left Kidney:  1.8 cm cyst in the lower pole which appears benign. Slight fullness of the collecting system.  Normal echotexture.  Abdominal aorta:  Limited visualization.  No aneurysm.  Normal ureteral jets are noted within the bladder.  IMPRESSION: Cholelithiasis.  No sonographic evidence of acute cholecystitis.  Slight fullness of the renal collecting systems bilaterally without frank hydronephrosis.   Original Report Authenticated By: Charlett Nose, M.D.    US Abdomen Limited  06/04/2012  *RADIOLOGY REPORT*  Clinical Data:  Abdominal pain, nausea, vomiting.  LIMITED ABDOMINAL ULTRASOUND - RIGHT UPPER QUADRANT  Comparison:  05/30/2012  Findings:  Gallbladder:  Multiple small mobile gallstones as well as sludge within the gallbladder.  No gallbladder wall thickening.  Negative sonographic Murphy's.  Appearance similar to recent study.  Common bile duct:  Normal caliber, 6 mm.  Liver:  Heterogeneous, increased echotexture, more appreciable on today's study.  This could represent fatty infiltration. Hypoechoic area adjacent to the gallbladder fossa could reflect focal fatty sparing.  IMPRESSION: Cholelithiasis and gallbladder sludge.  No evidence of acute cholecystitis.  Suspect fatty infiltration of the liver.                    Original Report Authenticated By: Charlett Nose, M.D.    Dg Chest Portable 1  View  06/04/2012  *RADIOLOGY REPORT*  Clinical Data: Right-sided pain  PORTABLE CHEST - 1 VIEW  Comparison: 05/30/2012  Findings: The heart size and mediastinal contours are within normal limits.  Both lungs are clear.  The visualized skeletal structures are unremarkable.  IMPRESSION: Negative exam.   Original Report Authenticated By: Signa Kell, M.D.     Microbiology: Recent Results (  from the past 240 hour(s))  URINE CULTURE     Status: Normal   Collection Time   06/05/12  6:54 AM      Component Value Range Status Comment   Specimen Description URINE, CLEAN CATCH   Final    Special Requests NONE   Final    Culture  Setup Time 06/05/2012 13:54   Final    Colony Count 3,000 COLONIES/ML   Final    Culture INSIGNIFICANT GROWTH   Final    Report Status 06/06/2012 FINAL   Final   CULTURE, BLOOD (ROUTINE X 2)     Status: Normal (Preliminary result)   Collection Time   06/05/12  7:00 AM      Component Value Range Status Comment   Specimen Description BLOOD RIGHT ARM   Final    Special Requests BOTTLES DRAWN AEROBIC ONLY 5CC   Final    Culture  Setup Time 06/05/2012 11:42   Final    Culture     Final    Value:        BLOOD CULTURE RECEIVED NO GROWTH TO DATE CULTURE WILL BE HELD FOR 5 DAYS BEFORE ISSUING A FINAL NEGATIVE REPORT   Report Status PENDING   Incomplete   CULTURE, BLOOD (ROUTINE X 2)     Status: Normal (Preliminary result)   Collection Time   06/05/12  7:10 AM      Component Value Range Status Comment   Specimen Description BLOOD RIGHT HAND   Final    Special Requests BOTTLES DRAWN AEROBIC ONLY 5CC   Final    Culture  Setup Time 06/05/2012 11:42   Final    Culture     Final    Value:        BLOOD CULTURE RECEIVED NO GROWTH TO DATE CULTURE WILL BE HELD FOR 5 DAYS BEFORE ISSUING A FINAL NEGATIVE REPORT   Report Status PENDING   Incomplete   MRSA PCR SCREENING     Status: Normal   Collection Time   06/05/12  8:46 AM      Component Value Range Status Comment   MRSA by PCR  NEGATIVE  NEGATIVE Final      Labs: Basic Metabolic Panel:  Lab 06/09/12 1610 06/08/12 0539 06/07/12 0435 06/06/12 0515 06/03/12 2318  NA 135 134* 134* 134* 135  K 3.5 4.1 3.5 3.2* 4.1  CL 100 101 102 100 97  CO2 26 24 24 25 23   GLUCOSE 145* 152* 144* 154* 167*  BUN 9 14 19 13 23   CREATININE 1.09 1.08 1.17 1.02 1.05  CALCIUM 8.0* 8.2* 7.7* 8.1* 9.8  MG -- -- -- -- --  PHOS -- -- -- -- --   Liver Function Tests:  Lab 06/03/12 2318  AST 21  ALT 20  ALKPHOS 47  BILITOT 0.5  PROT 6.9  ALBUMIN 3.8    Lab 06/03/12 2318  LIPASE 17  AMYLASE --   No results found for this basename: AMMONIA:5 in the last 168 hours CBC:  Lab 06/07/12 0435 06/06/12 0515 06/03/12 2318  WBC 9.9 10.3 12.3*  NEUTROABS -- -- 9.1*  HGB 10.2* 11.9* 13.7  HCT 29.2* 34.3* 38.8*  MCV 91.0 89.8 88.0  PLT 130* 141* 189   Cardiac Enzymes: No results found for this basename: CKTOTAL:5,CKMB:5,CKMBINDEX:5,TROPONINI:5 in the last 168 hours BNP: BNP (last 3 results) No results found for this basename: PROBNP:3 in the last 8760 hours CBG:  Lab 06/09/12 1202 06/09/12 0727 06/09/12 0447 06/09/12 0020 06/08/12 2026  GLUCAP 200* 143* 132* 137* 173*       Signed:  Chinyere Galiano A  [facility] 06/09/2012, 12:29 PM

## 2012-06-09 NOTE — Care Management Note (Signed)
    Page 1 of 1   06/09/2012     11:08:38 AM   CARE MANAGEMENT NOTE 06/09/2012  Patient:  Steve Wiggins, Steve Wiggins   Account Number:  [id number]  Date Initiated:  06/09/2012  Documentation initiated by:  Letha Cape  Subjective/Objective Assessment:   dx gangrenous cholecystitis  admit- lives with wife, pta independent. wife has dementia.     Action/Plan:   Anticipated DC Date:  06/09/2012   Anticipated DC Plan:  HOME/SELF CARE      DC Planning Services  CM consult      Choice offered to / List presented to:             Status of service:  Completed, signed off Medicare Important Message given?  NO (If response is "NO", the following Medicare IM given date fields will be blank) Date Medicare IM given:   Date Additional Medicare IM given:  06/09/2012  Discharge Disposition:  HOME/SELF CARE  Per UR Regulation:  Reviewed for med. necessity/level of care/duration of stay  If discussed at Long Length of Stay Meetings, dates discussed:    Comments:  06/09/12 10:50 Letha Cape RN, BSN [phone] patient lives with spouse, pta independent, patient has medication coverage and transportation.  Patient states he does not need ast for himself but needs ast for his wife because she can not take her medication correctly, informed patient that she will need to get hh set up through her PCP office because I can only set up hh for him since he is the patient here in the hospital, but I can give him a private duty list, which he agreed to take.  Informed him I will check on this to what else we would be able to do.  Spoke with patient again and he states that his wife does not want any HH help.

## 2012-06-09 NOTE — Progress Notes (Signed)
NURSING PROGRESS NOTE  Steve Wiggins [id number] Discharge Data: 06/09/2012 4:06 PM Attending Provider: Clydia Llano, MD BJY:NWGN,FAOZHY Kathie Rhodes, MD     Veda Canning to be D/C'd Home per MD order.  Discussed with the patient the After Visit Summary and all questions fully answered. All IV's discontinued with no bleeding noted. All belongings returned to patient for patient to take home.   Last Vital Signs:  Blood pressure 131/64, pulse 60, temperature 98 F (36.7 C), temperature source Oral, resp. rate 18, height 5\' 7"  (1.702 m), weight 100.5 kg (221 lb 9 oz), SpO2 91.00%.  Discharge Medication List   Medication List     As of 06/09/2012  4:06 PM    TAKE these medications         allopurinol 100 MG tablet   Commonly known as: ZYLOPRIM   Take 100 mg by mouth daily.      amLODipine 5 MG tablet   Commonly known as: NORVASC   Take 5 mg by mouth every morning.      amoxicillin-clavulanate 875-125 MG per tablet   Commonly known as: AUGMENTIN   Take 1 tablet by mouth 2 (two) times daily.      aspirin 81 MG tablet   Take 81 mg by mouth every morning.      carvedilol 25 MG tablet   Commonly known as: COREG   Take 1 tablet (25 mg total) by mouth 2 (two) times daily.      diphenhydramine-acetaminophen 25-500 MG Tabs   Commonly known as: TYLENOL PM   Take 1 tablet by mouth at bedtime as needed. For sleep      fenofibrate 145 MG tablet   Commonly known as: TRICOR   Take 145 mg by mouth daily.      glimepiride 4 MG tablet   Commonly known as: AMARYL   Take 4 mg by mouth daily before breakfast.      hydrochlorothiazide 25 MG tablet   Commonly known as: HYDRODIURIL   Take 25 mg by mouth every morning.      lisinopril 20 MG tablet   Commonly known as: PRINIVIL,ZESTRIL   Take 40 mg by mouth every morning.      metFORMIN 500 MG tablet   Commonly known as: GLUCOPHAGE   Take 500 mg by mouth 3 (three) times daily.      omeprazole 40 MG capsule   Commonly known as:  PRILOSEC   Take 40 mg by mouth every evening.      ondansetron 4 MG tablet   Commonly known as: ZOFRAN   Take 1 tablet (4 mg total) by mouth every 6 (six) hours.      oxyCODONE-acetaminophen 5-325 MG per tablet   Commonly known as: PERCOCET/ROXICET   Take 1-2 tablets by mouth every 6 (six) hours as needed for pain.      polyethylene glycol packet   Commonly known as: MIRALAX / GLYCOLAX   Take 17 g by mouth daily.      simvastatin 20 MG tablet   Commonly known as: ZOCOR   Take 20 mg by mouth every evening.        Rosalie Doctor, RN

## 2012-06-09 NOTE — Progress Notes (Signed)
Home soon.  Will reassess in the AM if still in the hospital.  Marta Lamas. Gae Bon, MD, FACS [phone] [facility]

## 2012-06-11 LAB — CULTURE, BLOOD (ROUTINE X 2)
Culture: NO GROWTH
Culture: NO GROWTH

## 2012-06-13 ENCOUNTER — Other Ambulatory Visit (INDEPENDENT_AMBULATORY_CARE_PROVIDER_SITE_OTHER): Payer: Self-pay

## 2012-06-13 ENCOUNTER — Telehealth (INDEPENDENT_AMBULATORY_CARE_PROVIDER_SITE_OTHER): Payer: Self-pay

## 2012-06-13 ENCOUNTER — Other Ambulatory Visit (INDEPENDENT_AMBULATORY_CARE_PROVIDER_SITE_OTHER): Payer: Self-pay | Admitting: General Surgery

## 2012-06-13 DIAGNOSIS — R945 Abnormal results of liver function studies: Secondary | ICD-10-CM

## 2012-06-13 LAB — COMPREHENSIVE METABOLIC PANEL
ALT: 36 U/L (ref 0–53)
BUN: 11 mg/dL (ref 6–23)
CO2: 22 mEq/L (ref 19–32)
Calcium: 9.5 mg/dL (ref 8.4–10.5)
Chloride: 106 mEq/L (ref 96–112)
Creat: 1.2 mg/dL (ref 0.50–1.35)
Glucose, Bld: 216 mg/dL — ABNORMAL HIGH (ref 70–99)

## 2012-06-13 LAB — CBC W/MCH & 3 PART DIFF
Lymphocytes Relative: 20 % (ref 12–46)
Lymphs Abs: 1.6 10*3/uL (ref 0.7–4.0)
Neutro Abs: 5.9 10*3/uL (ref 1.7–7.7)
Neutrophils Relative %: 73 % (ref 43–77)
Platelets: 264 10*3/uL (ref 150–400)
RBC: 3.71 MIL/uL — ABNORMAL LOW (ref 4.22–5.81)
RDW: 12.8 % (ref 11.5–15.5)
WBC: 8.1 10*3/uL (ref 4.0–10.5)

## 2012-06-13 NOTE — Telephone Encounter (Signed)
Pt called c/o daily nausea that last all day. Pt states is tolerating food without vomiting. Pt states temp is normal and no abd pain. Pt states he is still having some difficulty voiding. He states his stream takes longer to start and slow to urinate. I reviewed this with Dr Dwain Sarna. Per Dr Dwain Sarna Pt to go to lab for stat cbc and stat cmet. Pt advised per Dr Dwain Sarna I have called in phenergan 25mg  tablets #10 one po q 6h prn nausea and to call Dr Diona Fanti office to see him for continued voiding problems. Pt states he will go to lab now and understands he will need to contact Dr Wynelle Link for voiding difficulty. Lab orders put in epic as stat and also faxed to [facility] as stats to call result stat.

## 2012-06-13 NOTE — Telephone Encounter (Signed)
Cindy at [facility] lab called with stat labs on pt. She states lab has glitch and electrolytes are still pending. Only abn labs on cbc and cmet are hgb 11.8 hct 33.9 glucose 216 and albumin 3.1. She will send electrolytes asap. Results routed to Dr Dwain Sarna and his assistant.

## 2012-06-23 ENCOUNTER — Encounter (HOSPITAL_COMMUNITY): Admission: RE | Admit: 2012-06-23 | Payer: Medicare Other | Source: Ambulatory Visit

## 2012-06-24 ENCOUNTER — Ambulatory Visit (INDEPENDENT_AMBULATORY_CARE_PROVIDER_SITE_OTHER): Payer: Medicare Other | Admitting: General Surgery

## 2012-06-24 ENCOUNTER — Encounter (INDEPENDENT_AMBULATORY_CARE_PROVIDER_SITE_OTHER): Payer: Self-pay | Admitting: General Surgery

## 2012-06-24 VITALS — BP 138/66 | HR 59 | Temp 97.6°F | Ht 67.0 in | Wt 219.0 lb

## 2012-06-24 DIAGNOSIS — Z9889 Other specified postprocedural states: Secondary | ICD-10-CM

## 2012-06-24 DIAGNOSIS — K8000 Calculus of gallbladder with acute cholecystitis without obstruction: Secondary | ICD-10-CM

## 2012-06-24 NOTE — Progress Notes (Signed)
Steve Wiggins [facility] 1941/03/21 [id number] 06/24/2012   Steve Wiggins is a 71 y.o. male who had a laparoscopic cholecystectomy with intraoperative cholangiogram by Dr. Dwain Sarna.  The pathology report confirmed ulcerated gallbladder mucosa with acute and chronic cholecystitis.  The patient reports that they are feeling well with normal bowel movements and good appetite.  The pre-operative symptoms of abdominal pain and vomiting have resolved.  He was having some post op nausea, but this has improved over the last several days.  Physical examination - Incisions appear well-healed with no sign of infection or bleeding.   Abdomen - soft, non-tender  Impression:  s/p laparoscopic cholecystectomy  Plan:  He may resume a regular diet and full activity.  He may follow-up on a PRN basis.

## 2012-06-24 NOTE — Patient Instructions (Signed)
Follow up as needed

## 2012-07-07 ENCOUNTER — Ambulatory Visit: Admit: 2012-07-07 | Payer: Self-pay | Admitting: General Surgery

## 2012-07-15 ENCOUNTER — Encounter (INDEPENDENT_AMBULATORY_CARE_PROVIDER_SITE_OTHER): Payer: Self-pay

## 2012-07-22 ENCOUNTER — Encounter (INDEPENDENT_AMBULATORY_CARE_PROVIDER_SITE_OTHER): Payer: Medicare Other | Admitting: General Surgery

## 2012-09-22 ENCOUNTER — Telehealth: Payer: Self-pay | Admitting: Cardiovascular Disease

## 2012-09-22 NOTE — Telephone Encounter (Signed)
Pt calling re BP last reading 172/ he forgot close to 80

## 2012-09-22 NOTE — Telephone Encounter (Signed)
I spoke with the pt and his BP remains elevated at home. The pt did take his BP monitor into his PCP and it was accurate.  The pt's most recent BP readings have been: 173/74, 162/73, 126/63 (best BP that he has seen), 172/68, 166/70, 163/74 and 146/70.  I will forward this message to Dr Excell Seltzer to review readings and make further recommendations.

## 2012-09-28 NOTE — Telephone Encounter (Signed)
Recommend trial of hydralazine 25 mg TID

## 2012-09-29 MED ORDER — HYDRALAZINE HCL 25 MG PO TABS: 25.0000 mg | ORAL_TABLET | Freq: Three times a day (TID) | ORAL | Status: DC

## 2012-09-29 NOTE — Telephone Encounter (Signed)
Spoke with pt, aware of dr cooper's recommendations. Script called to pharm. He will cont to track his blood pressure and let us know of any problems.

## 2013-03-17 ENCOUNTER — Encounter: Payer: Self-pay | Admitting: Cardiovascular Disease

## 2013-03-17 ENCOUNTER — Ambulatory Visit (INDEPENDENT_AMBULATORY_CARE_PROVIDER_SITE_OTHER): Payer: Medicare Other | Admitting: Cardiovascular Disease

## 2013-03-17 VITALS — BP 148/78 | HR 51 | Ht 67.5 in | Wt 222.0 lb

## 2013-03-17 DIAGNOSIS — I10 Essential (primary) hypertension: Secondary | ICD-10-CM

## 2013-03-17 DIAGNOSIS — E785 Hyperlipidemia, unspecified: Secondary | ICD-10-CM

## 2013-03-17 DIAGNOSIS — I2510 Atherosclerotic heart disease of native coronary artery without angina pectoris: Secondary | ICD-10-CM

## 2013-03-17 MED ORDER — HYDROCHLOROTHIAZIDE 25 MG PO TABS: 25.0000 mg | ORAL_TABLET | Freq: Every morning | ORAL | Status: DC

## 2013-03-17 MED ORDER — LISINOPRIL 20 MG PO TABS: 40.0000 mg | ORAL_TABLET | Freq: Every morning | ORAL | Status: DC

## 2013-03-17 MED ORDER — AMLODIPINE BESYLATE 5 MG PO TABS: 5.0000 mg | ORAL_TABLET | Freq: Every morning | ORAL | Status: DC

## 2013-03-17 MED ORDER — HYDRALAZINE HCL 25 MG PO TABS: 25.0000 mg | ORAL_TABLET | Freq: Three times a day (TID) | ORAL | Status: DC

## 2013-03-17 MED ORDER — CARVEDILOL 25 MG PO TABS: 25.0000 mg | ORAL_TABLET | Freq: Two times a day (BID) | ORAL | Status: DC

## 2013-03-17 NOTE — Progress Notes (Signed)
HPI:  72 year old gentleman presenting for followup evaluation. The patient is followed for coronary artery disease and hypertension. Blood pressure has required multiple medications. He's had no anginal symptoms over the last few years. He did have cholecystitis last year requiring urgent cholecystectomy because of gallbladder necrosis.he did not have any cardiac complications from surgery. He presents today for followup.  He is doing well without chest pain or shortness of breath. He denies leg swelling. Blood pressure has been elevated but better than in the past. He denies lightheadedness, edema, or syncope. He is compliant with his medications.  Outpatient Encounter Prescriptions as of 03/17/2013  Medication Sig Dispense Refill  . amLODipine (NORVASC) 5 MG tablet Take 5 mg by mouth every morning.      Marland Kitchen aspirin 81 MG tablet Take 81 mg by mouth every morning.       . carvedilol (COREG) 25 MG tablet Take 25 mg by mouth 2 (two) times daily with a meal.      . glimepiride (AMARYL) 4 MG tablet Take 4 mg by mouth daily before breakfast.      . hydrALAZINE (APRESOLINE) 25 MG tablet Take 25 mg by mouth 3 (three) times daily.      . hydrochlorothiazide (HYDRODIURIL) 25 MG tablet Take 25 mg by mouth every morning.      . IBUPROFEN PO Take by mouth. PRN FOR PAIN      . lisinopril (PRINIVIL,ZESTRIL) 20 MG tablet Take 40 mg by mouth every morning.       . metFORMIN (GLUCOPHAGE) 500 MG tablet Take 500 mg by mouth 3 (three) times daily.      Marland Kitchen omeprazole (PRILOSEC) 40 MG capsule Take 40 mg by mouth every evening.       . simvastatin (ZOCOR) 40 MG tablet Take 40 mg by mouth every evening.      . vitamin E 400 UNIT capsule Take 400 Units by mouth daily.      . [DISCONTINUED] allopurinol (ZYLOPRIM) 100 MG tablet Take 100 mg by mouth daily.      . [DISCONTINUED] amoxicillin-clavulanate (AUGMENTIN) 875-125 MG per tablet Take 1 tablet by mouth 2 (two) times daily.  10 tablet  0  . [DISCONTINUED]  diphenhydramine-acetaminophen (TYLENOL PM) 25-500 MG TABS Take 1 tablet by mouth at bedtime as needed. For sleep      . [DISCONTINUED] fenofibrate (TRICOR) 145 MG tablet Take 145 mg by mouth daily.      . [DISCONTINUED] glimepiride (AMARYL) 4 MG tablet Take 4 mg by mouth daily before breakfast.       . [DISCONTINUED] hydrALAZINE (APRESOLINE) 25 MG tablet Take 1 tablet (25 mg total) by mouth 3 (three) times daily.  90 tablet  12  . [DISCONTINUED] metFORMIN (GLUCOPHAGE) 500 MG tablet Take 500 mg by mouth 3 (three) times daily.      . [DISCONTINUED] ondansetron (ZOFRAN) 4 MG tablet Take 1 tablet (4 mg total) by mouth every 6 (six) hours.  12 tablet  0  . [DISCONTINUED] oxyCODONE-acetaminophen (PERCOCET/ROXICET) 5-325 MG per tablet Take 1-2 tablets by mouth every 6 (six) hours as needed for pain.  30 tablet  0  . [DISCONTINUED] polyethylene glycol (MIRALAX / GLYCOLAX) packet Take 17 g by mouth daily.  24 each  0  . [DISCONTINUED] promethazine (PHENERGAN) 25 MG tablet       . [DISCONTINUED] simvastatin (ZOCOR) 20 MG tablet Take 20 mg by mouth every evening.       No facility-administered encounter medications on file as of  03/17/2013.    No Known Allergies  Past Medical History  Diagnosis Date  . Diabetes mellitus     type 2  . Hypertension   . Obesity   . Coronary artery disease     a. 07/2010 echo: nl EF;  b. 08/2010 Cath/PCI: Resolute DES to OM  . Mild carotid artery disease     a. 07/2010 Carotid U/S: bilat ICA 0-35%   . Hyperlipidemia   . History of head injury FELL OFF LADDER,  1999--  S/P DRAINAGE SUBDERMAL HEMATOMA--  NO RESIDUAL  . GERD (gastroesophageal reflux disease)   . Umbilical hernia   . History of gastric ulcer   . Penile lesion   . History of gout    ROS: Negative except as per HPI  BP 148/78  Pulse 51  Ht 5' 7.5" (1.715 m)  Wt 222 lb (100.699 kg)  BMI 34.24 kg/m2  SpO2 97%  PHYSICAL EXAM: Pt is alert and oriented, NAD HEENT: normal Neck: JVP - normal, carotids  2+= without bruits Lungs: CTA bilaterally CV: bradycardic and regular without murmur or gallop Abd: soft, NT, Positive BS, no hepatomegaly Ext: no C/C/E, distal pulses intact and equal Skin: warm/dry no rash  EKG:  Sinus bradycardia 51 beats per minute, but cannot rule out inferior infarct age undetermined.  ASSESSMENT AND PLAN: 1. Coronary artery disease, native vessel. The patient is status post PCI several years ago. He remains on aspirin for antiplatelet therapy. His medications were reviewed and they are appropriate. He will return for followup in 1 year. He is having no anginal symptoms.  2. Malignant hypertension. Blood pressure control is acceptable on a combination of amlodipine, carvedilol, hydralazine, hydrochlorothiazide, and lisinopril. Will check a metabolic panel in 3 months prescriptions were written for his antihypertensive drugs.  3. Hyperlipidemia. The patient is on simvastatin. When he comes in for labs we'll check a lipid panel and LFTs.  Tonny Bollman 03/17/2013 1:52 PM

## 2013-03-17 NOTE — Patient Instructions (Addendum)
Your physician recommends that you return for a FASTING LIPID, LIVER and BMP--nothing to eat or drink after midnight, lab opens at 7:30  Your physician wants you to follow-up in: 1 YEAR with Dr Excell Seltzer.  You will receive a reminder letter in the mail two months in advance. If you don't receive a letter, please call our office to schedule the follow-up appointment.  Your physician recommends that you continue on your current medications as directed. Please refer to the Current Medication list given to you today.

## 2013-03-25 ENCOUNTER — Other Ambulatory Visit (INDEPENDENT_AMBULATORY_CARE_PROVIDER_SITE_OTHER): Payer: Medicare Other

## 2013-03-25 DIAGNOSIS — E785 Hyperlipidemia, unspecified: Secondary | ICD-10-CM

## 2013-03-25 DIAGNOSIS — I2510 Atherosclerotic heart disease of native coronary artery without angina pectoris: Secondary | ICD-10-CM

## 2013-03-25 DIAGNOSIS — I10 Essential (primary) hypertension: Secondary | ICD-10-CM

## 2013-03-25 LAB — LDL CHOLESTEROL, DIRECT: Direct LDL: 64.6 mg/dL

## 2013-03-25 LAB — LIPID PANEL
Cholesterol: 117 mg/dL (ref 0–200)
HDL: 27 mg/dL — ABNORMAL LOW (ref >39.00)
Triglycerides: 222 mg/dL — ABNORMAL HIGH (ref 0.0–149.0)
VLDL: 44.4 mg/dL — ABNORMAL HIGH (ref 0.0–40.0)

## 2013-03-25 LAB — HEPATIC FUNCTION PANEL
Albumin: 4 g/dL (ref 3.5–5.2)
Alkaline Phosphatase: 41 U/L (ref 39–117)
Total Protein: 6.7 g/dL (ref 6.0–8.3)

## 2013-03-25 LAB — BASIC METABOLIC PANEL
Calcium: 9 mg/dL (ref 8.4–10.5)
Chloride: 103 mEq/L (ref 96–112)
Creatinine, Ser: 0.9 mg/dL (ref 0.4–1.5)
Sodium: 135 mEq/L (ref 135–145)

## 2013-05-05 ENCOUNTER — Ambulatory Visit: Payer: Medicare Other | Admitting: Family Medicine

## 2013-06-05 ENCOUNTER — Ambulatory Visit: Payer: Medicare Other | Admitting: Family Medicine

## 2013-12-18 ENCOUNTER — Telehealth: Payer: Self-pay | Admitting: Cardiovascular Disease

## 2013-12-18 DIAGNOSIS — I2510 Atherosclerotic heart disease of native coronary artery without angina pectoris: Secondary | ICD-10-CM

## 2013-12-18 DIAGNOSIS — E785 Hyperlipidemia, unspecified: Secondary | ICD-10-CM

## 2013-12-18 DIAGNOSIS — I10 Essential (primary) hypertension: Secondary | ICD-10-CM

## 2013-12-18 NOTE — Telephone Encounter (Signed)
I spoke with the pt and he stopped taking Amlodipine 10mg  due to swelling in his lower extremities. The pt said his legs are so swollen that it makes his skin itch and burn.  The pt just restarted Amlodipine after seeing his PCP due to elevated BP.    BP prior to restarting Amlodipine:175/72, 174/70, 164/72, 157/70, 154/73 BP since restarting Amlodipine: 150/77 today, 149/67 Thursday, 153/72 Wednesday  The pt does not want to take higher dose of Amlodipine and would like to go back to 5mg  daily.  I instructed the pt that he can go back to 5mg  daily and I will have Dr Excell Seltzer review his medications and make further recommendations about treating BP.  The most recent BP medication that was started last year was hydralazine 25mg  tid otherwise the pt is on maximum therapy with other BP medications.

## 2013-12-18 NOTE — Telephone Encounter (Signed)
New message    C/O swelling in feet / ankle . Amlodipine  10 mg .

## 2013-12-20 NOTE — Telephone Encounter (Signed)
Would increase hydralazine to 50 mg TID and bring in BP readings for a PA/NP visit in a few weeks. May need clonidine added. thx

## 2013-12-21 MED ORDER — HYDRALAZINE HCL 50 MG PO TABS: 50.0000 mg | ORAL_TABLET | Freq: Three times a day (TID) | ORAL | Status: DC

## 2013-12-21 MED ORDER — AMLODIPINE BESYLATE 5 MG PO TABS: 5.0000 mg | ORAL_TABLET | Freq: Every morning | ORAL | Status: DC

## 2013-12-21 NOTE — Telephone Encounter (Signed)
Follow up      Patient calling back regarding have you spoken with Dr. Excell Seltzer.

## 2013-12-21 NOTE — Telephone Encounter (Signed)
I spoke with the pt and made him aware he needs to increase hydralazine.  New Rx sent to pharmacy.  Appointment scheduled on 01/18/14 with Tereso Newcomer PA-C. The pt will continue to monitor BP and bring readings into the office.

## 2014-01-18 ENCOUNTER — Ambulatory Visit: Payer: Medicare Other | Admitting: Physician Assistant

## 2014-02-08 ENCOUNTER — Ambulatory Visit: Payer: Medicare Other | Admitting: Physician Assistant

## 2014-03-17 ENCOUNTER — Encounter: Payer: Self-pay | Admitting: Family Medicine

## 2014-03-17 ENCOUNTER — Ambulatory Visit (INDEPENDENT_AMBULATORY_CARE_PROVIDER_SITE_OTHER): Payer: Medicare HMO | Admitting: Family Medicine

## 2014-03-17 VITALS — BP 140/80 | HR 78 | Temp 98.0°F | Resp 16 | Ht 69.0 in | Wt 233.0 lb

## 2014-03-17 DIAGNOSIS — Z636 Dependent relative needing care at home: Secondary | ICD-10-CM

## 2014-03-17 DIAGNOSIS — E1142 Type 2 diabetes mellitus with diabetic polyneuropathy: Secondary | ICD-10-CM

## 2014-03-17 DIAGNOSIS — I2510 Atherosclerotic heart disease of native coronary artery without angina pectoris: Secondary | ICD-10-CM

## 2014-03-17 DIAGNOSIS — E1140 Type 2 diabetes mellitus with diabetic neuropathy, unspecified: Secondary | ICD-10-CM

## 2014-03-17 DIAGNOSIS — I10 Essential (primary) hypertension: Secondary | ICD-10-CM

## 2014-03-17 DIAGNOSIS — E1149 Type 2 diabetes mellitus with other diabetic neurological complication: Secondary | ICD-10-CM

## 2014-03-17 DIAGNOSIS — E785 Hyperlipidemia, unspecified: Secondary | ICD-10-CM

## 2014-03-17 DIAGNOSIS — Z6379 Other stressful life events affecting family and household: Secondary | ICD-10-CM

## 2014-03-17 NOTE — Progress Notes (Signed)
   Subjective:    Patient ID: Steve Wiggins, male    DOB: September 28, 1940, 73 y.o.   MRN: [id number]  HPI New to establish.  Previous MD- Mauricio Po, NP [place].  Insurance required pt to switch to MD.  HTN- chronic problem, on Lisinopril, Amlodipine, Hydrochlorothiazide, Hydralazine, and Carvedilol.  Adequate control.  Denies CP, SOB, HAs, visual changes, edema.  Hyperlipidemia- chronic problem, pt had medication switched to Crestor  from Simvastatin .  Pt had labs done in June- LDL 60, Trigs 272.  No abd pain, N/V.  DM- chronic problem, dx'd '15-20 yrs ago'.  On Metformin  BID, Amaryl daily.  On ACE for renal protection.  Due for eye exam- last done 2 yrs ago.  + neuropathy in feet, R>L, 'burning and itching at night'.  + diarrhea.  Rare symptomatic lows.  Checking CBGs.  CAD- chronic problem, following w/ Dr Excell Seltzer.    Caregiver stress- pt's wife w/ advanced Alzheimers.  He reports that he is unable to leave wife alone due to her agitation, unable to take her places due to agitation.  Refusing to put her in NH.  Very limited family assistance.   Review of Systems For ROS see HPI     Objective:   Physical Exam        Assessment & Plan:

## 2014-03-17 NOTE — Patient Instructions (Signed)
Schedule a diabetes and cholesterol follow up in Nov or December (we will do labs at this time so please don't eat) No med changes at this time Please schedule a diabetic eye exam at your convenience Please make sure you are taking care of yourself (going to cardiology appts, etc)- we're going to try and get you some help! Call with any questions or concerns Hang in there!!

## 2014-03-17 NOTE — Progress Notes (Signed)
Pre visit review using our clinic review tool, if applicable. No additional management support is needed unless otherwise documented below in the visit note. 

## 2014-03-18 ENCOUNTER — Telehealth: Payer: Self-pay | Admitting: Family Medicine

## 2014-03-18 NOTE — Telephone Encounter (Signed)
noted 

## 2014-03-18 NOTE — Telephone Encounter (Signed)
Caller name: Preet  Relation to pt: self  Call back number: [phone]   Reason for call:  Pt wanted to inform you Dr. Loraine Leriche C. Vernie Ammons, MD prescribes Tamsulosin HCL 0.4MG  2 capsules a day for prostat.

## 2014-03-20 ENCOUNTER — Other Ambulatory Visit: Payer: Self-pay | Admitting: Family Medicine

## 2014-03-22 NOTE — Telephone Encounter (Signed)
Med filled.  

## 2014-03-24 DIAGNOSIS — Z636 Dependent relative needing care at home: Secondary | ICD-10-CM | POA: Insufficient documentation

## 2014-03-24 DIAGNOSIS — E1140 Type 2 diabetes mellitus with diabetic neuropathy, unspecified: Secondary | ICD-10-CM | POA: Insufficient documentation

## 2014-03-24 NOTE — Assessment & Plan Note (Signed)
New to provider, ongoing for pt.  Adequate control.  Asymptomatic.  No med changes at this time.

## 2014-03-24 NOTE — Assessment & Plan Note (Signed)
New.  Unfortunately pt has very limited understanding of wife's Alzheimer's dx and disease progression.  Discussed today that her situation is serious, not going to improve, and is going to worsen- placing even more physical and emotional stress on him.  Discussed Hospice option and pt is open to this idea.  Will follow.

## 2014-03-24 NOTE — Assessment & Plan Note (Signed)
New to provider, ongoing for pt.  He reports recent labs.  Tolerating statin w/o difficulty.  Will get labs to review and adjust meds prn.

## 2014-03-24 NOTE — Assessment & Plan Note (Addendum)
New to provider, ongoing for pt.  Due for eye exam- encouraged him to schedule but understand this is difficult as wife's caregiver.  On ACE for renal protection.  On statin.  + neuropathy- pt not interested in medication at this time.  Pt reports recent labs w/ previous provider- will not repeat today.  Will get record release and review.

## 2014-03-24 NOTE — Assessment & Plan Note (Signed)
New to provider, ongoing for pt.  Following w/ Dr Excell Seltzer.  Stressed need to keep cardiology appts despite his role as caregiver.  Currently asymptomatic.  Will follow.

## 2014-03-26 ENCOUNTER — Ambulatory Visit (INDEPENDENT_AMBULATORY_CARE_PROVIDER_SITE_OTHER): Payer: Medicare HMO | Admitting: Cardiovascular Disease

## 2014-03-26 ENCOUNTER — Encounter: Payer: Self-pay | Admitting: Cardiovascular Disease

## 2014-03-26 VITALS — BP 158/70 | HR 55 | Ht 69.0 in | Wt 234.0 lb

## 2014-03-26 DIAGNOSIS — E785 Hyperlipidemia, unspecified: Secondary | ICD-10-CM

## 2014-03-26 DIAGNOSIS — I10 Essential (primary) hypertension: Secondary | ICD-10-CM

## 2014-03-26 NOTE — Patient Instructions (Signed)
Your physician wants you to follow-up in: 1 YEAR with Dr Cooper.  You will receive a reminder letter in the mail two months in advance. If you don't receive a letter, please call our office to schedule the follow-up appointment.  Your physician recommends that you continue on your current medications as directed. Please refer to the Current Medication list given to you today.  

## 2014-03-26 NOTE — Progress Notes (Signed)
HPI:  73 year old gentleman presenting for followup evaluation. The patient is followed for coronary artery disease and hypertension. Blood pressure has required multiple medications. He's had no anginal symptoms over the last few years.  The patient has had a very difficult time over the past year. His wife has progressive Alzheimer's dementia, and he is her sole caregiver. They attempted to get hospice involved recently, but it was determined she would not yet qualify for their services. He is unable to leave the house because she becomes very agitated. She's lost 40 pounds over the past year. They are on a fixed income and he does not have available resources to get help in the house.  From a cardiac perspective, he has no specific complaints. He has occasional discomfort in his chest at rest, but this is consistent with his previous gastroesophageal reflux. He is not engaged in any routine exercise. He denies exertional chest pain or pressure, dyspnea, or lightheadedness. He does have chronic leg swelling. He is compliant with his medical program. Home blood pressure readings are in the 140 to 150s.   Outpatient Encounter Prescriptions as of 03/26/2014  Medication Sig  . amLODipine (NORVASC) 5 MG tablet Take 1 tablet (5 mg total) by mouth every morning.  Marland Kitchen aspirin 81 MG tablet Take 81 mg by mouth every morning.   . carvedilol (COREG) 25 MG tablet Take 1 tablet (25 mg total) by mouth 2 (two) times daily with a meal.  . glimepiride (AMARYL) 4 MG tablet Take 4 mg by mouth daily before breakfast.  . hydrALAZINE (APRESOLINE) 50 MG tablet Take 1 tablet (50 mg total) by mouth 3 (three) times daily.  . hydrochlorothiazide (HYDRODIURIL) 25 MG tablet Take 1 tablet (25 mg total) by mouth every morning.  . IBUPROFEN PO Take by mouth. PRN FOR PAIN  . lisinopril (PRINIVIL,ZESTRIL) 20 MG tablet Take 2 tablets (40 mg total) by mouth every morning.  . metFORMIN (GLUCOPHAGE) 1000 MG tablet TAKE 1 TABLET TWICE  A DAY  . Omega-3 Fatty Acids (FISH OIL) 1000 MG CAPS Take by mouth.  Marland Kitchen omeprazole (PRILOSEC) 40 MG capsule Take 40 mg by mouth every evening.   . rosuvastatin (CRESTOR) 10 MG tablet Take 10 mg by mouth daily.  . tamsulosin (FLOMAX) 0.4 MG CAPS capsule Take 0.8 mg by mouth daily after supper.   . vitamin E 400 UNIT capsule Take 400 Units by mouth daily.    No Known Allergies  Past Medical History  Diagnosis Date  . Diabetes mellitus     type 2  . Hypertension   . Obesity   . Coronary artery disease     a. 07/2010 echo: nl EF;  b. 08/2010 Cath/PCI: Resolute DES to OM  . Mild carotid artery disease     a. 07/2010 Carotid U/S: bilat ICA 0-35%   . Hyperlipidemia   . History of head injury FELL OFF LADDER,  1999--  S/P DRAINAGE SUBDERMAL HEMATOMA--  NO RESIDUAL  . GERD (gastroesophageal reflux disease)   . Umbilical hernia   . History of gastric ulcer   . Penile lesion   . History of gout     ROS: Negative except as per HPI  BP 158/70  Pulse 55  Ht  (1.753 m)  Wt 234 lb (106.142 kg)  BMI 34.54 kg/m2  PHYSICAL EXAM: Pt is alert and oriented, pleasant overweight male in NAD HEENT: normal Neck: JVP - normal, carotids 2+= without bruits Lungs: CTA bilaterally CV: RRR without murmur  or gallop Abd: soft, NT, Positive BS, no hepatomegaly Ext: 1+ pretibial edema bilaterally, distal pulses intact and equal Skin: warm/dry no rash  EKG:  Sinus bradycardia 55 beats per minute, otherwise within normal limits.  ASSESSMENT AND PLAN: 1. Coronary artery disease, native vessel. The patient is status post PCI several years ago. He remains on aspirin for antiplatelet therapy. His medications were reviewed and they are appropriate. He will return for followup in 1 year. He is having no anginal symptoms.   2. Malignant hypertension. Blood pressure control is suboptimal on a combination of amlodipine, carvedilol, hydralazine, hydrochlorothiazide, and lisinopril. Suspect primary issue is  related to very high stress at home as well as his weight gain. We had a lengthy discussion about coping mechanisms, the importance of weight loss, and need for exercise. The patient has difficulty leaving the house because of his wife's advanced Alzheimer's dementia. He is going to consider a treadmill or stationary bike so that he can get some exercise in the home.  3. Hyperlipidemia. The patient is on simvastatin. Last lipid 03/25/2013: Lipid Panel     Component Value Date/Time   CHOL 117 03/25/2013 0900   TRIG 222.0* 03/25/2013 0900   HDL 27.00* 03/25/2013 0900   CHOLHDL 4 03/25/2013 0900   VLDL 44.4* 03/25/2013 0900   Tonny Bollman 03/26/2014 10:55 AM

## 2014-04-26 ENCOUNTER — Other Ambulatory Visit: Payer: Self-pay | Admitting: Cardiovascular Disease

## 2014-05-09 ENCOUNTER — Other Ambulatory Visit: Payer: Self-pay | Admitting: Cardiovascular Disease

## 2014-05-27 ENCOUNTER — Other Ambulatory Visit: Payer: Self-pay | Admitting: Family Medicine

## 2014-05-27 NOTE — Telephone Encounter (Signed)
Med filled.  

## 2014-06-01 ENCOUNTER — Telehealth: Payer: Self-pay | Admitting: Family Medicine

## 2014-06-01 NOTE — Telephone Encounter (Signed)
Caller name: Paulding,Toa Relation to pt: spouse  Call back number: [phone] Pharmacy: [facility] [phone]  Reason for call:   Soar throat and nose running, ache feeling; spouse states he's wife is a lot to manage to bring her in for an acute visit requesting a rx. (advised pt to schedule an acute appointment so pcp can acccess and insisted cma calls him.

## 2014-06-01 NOTE — Telephone Encounter (Signed)
Pt following up from previous message, husband states he need to know if dr. Beverely Lowtabori can call something in or does he need to take pt to an urgent care

## 2014-06-01 NOTE — Telephone Encounter (Signed)
No need for UC visit- pt can have acute appt today or tomorrow in our office but does need appt

## 2014-06-01 NOTE — Telephone Encounter (Signed)
Taboridid not respond before she left. I would have him take her to an urgent care. She really needs to be seen.

## 2014-06-03 ENCOUNTER — Telehealth: Payer: Self-pay | Admitting: *Deleted

## 2014-06-03 NOTE — Telephone Encounter (Signed)
Received medical records via mail from [facility]. Records forwarded to Dr. Tabori's CMA. JG//CMA 

## 2014-06-04 ENCOUNTER — Other Ambulatory Visit: Payer: Self-pay | Admitting: Cardiovascular Disease

## 2014-07-01 ENCOUNTER — Encounter: Payer: Self-pay | Admitting: General Practice

## 2014-07-01 ENCOUNTER — Encounter: Payer: Self-pay | Admitting: Family Medicine

## 2014-07-01 ENCOUNTER — Ambulatory Visit (INDEPENDENT_AMBULATORY_CARE_PROVIDER_SITE_OTHER): Payer: Medicare HMO | Admitting: Family Medicine

## 2014-07-01 VITALS — BP 140/80 | HR 47 | Temp 97.9°F | Resp 17 | Wt 234.0 lb

## 2014-07-01 DIAGNOSIS — E1140 Type 2 diabetes mellitus with diabetic neuropathy, unspecified: Secondary | ICD-10-CM

## 2014-07-01 DIAGNOSIS — E785 Hyperlipidemia, unspecified: Secondary | ICD-10-CM

## 2014-07-01 DIAGNOSIS — I10 Essential (primary) hypertension: Secondary | ICD-10-CM

## 2014-07-01 LAB — CBC WITH DIFFERENTIAL/PLATELET
BASOS ABS: 0 10*3/uL (ref 0.0–0.1)
Basophils Relative: 0.5 % (ref 0.0–3.0)
Eosinophils Absolute: 0.2 10*3/uL (ref 0.0–0.7)
Eosinophils Relative: 3.7 % (ref 0.0–5.0)
HEMATOCRIT: 38.4 % — AB (ref 39.0–52.0)
HEMOGLOBIN: 12.7 g/dL — AB (ref 13.0–17.0)
LYMPHS ABS: 1.4 10*3/uL (ref 0.7–4.0)
Lymphocytes Relative: 21.4 % (ref 12.0–46.0)
MCHC: 33.2 g/dL (ref 30.0–36.0)
MCV: 92.2 fl (ref 78.0–100.0)
Monocytes Absolute: 0.6 10*3/uL (ref 0.1–1.0)
Monocytes Relative: 9.1 % (ref 3.0–12.0)
NEUTROS ABS: 4.2 10*3/uL (ref 1.4–7.7)
Neutrophils Relative %: 65.3 % (ref 43.0–77.0)
PLATELETS: 114 10*3/uL — AB (ref 150.0–400.0)
RBC: 4.16 Mil/uL — ABNORMAL LOW (ref 4.22–5.81)
RDW: 14.1 % (ref 11.5–15.5)
WBC: 6.5 10*3/uL (ref 4.0–10.5)

## 2014-07-01 LAB — LIPID PANEL
CHOLESTEROL: 93 mg/dL (ref 0–200)
HDL: 25.1 mg/dL — ABNORMAL LOW (ref >39.00)
LDL Cholesterol: 37 mg/dL (ref 0–99)
NONHDL: 67.9
Total CHOL/HDL Ratio: 4
Triglycerides: 154 mg/dL — ABNORMAL HIGH (ref 0.0–149.0)
VLDL: 30.8 mg/dL (ref 0.0–40.0)

## 2014-07-01 LAB — HEPATIC FUNCTION PANEL
ALT: 19 U/L (ref 0–53)
AST: 18 U/L (ref 0–37)
Albumin: 3.7 g/dL (ref 3.5–5.2)
Alkaline Phosphatase: 35 U/L — ABNORMAL LOW (ref 39–117)
BILIRUBIN DIRECT: 0.1 mg/dL (ref 0.0–0.3)
Total Bilirubin: 0.8 mg/dL (ref 0.2–1.2)
Total Protein: 6.1 g/dL (ref 6.0–8.3)

## 2014-07-01 LAB — BASIC METABOLIC PANEL
BUN: 12 mg/dL (ref 6–23)
CO2: 27 mEq/L (ref 19–32)
Calcium: 8.9 mg/dL (ref 8.4–10.5)
Chloride: 104 mEq/L (ref 96–112)
Creatinine, Ser: 1 mg/dL (ref 0.4–1.5)
GFR: 81.1 mL/min (ref >60.00)
Glucose, Bld: 140 mg/dL — ABNORMAL HIGH (ref 70–99)
Potassium: 4.4 mEq/L (ref 3.5–5.1)
Sodium: 136 mEq/L (ref 135–145)

## 2014-07-01 LAB — HEMOGLOBIN A1C: Hgb A1c MFr Bld: 6.8 % — ABNORMAL HIGH (ref 4.6–6.5)

## 2014-07-01 NOTE — Assessment & Plan Note (Signed)
Chronic problem.  Tolerating statin w/o difficulty.  Check labs.  Adjust meds prn  

## 2014-07-01 NOTE — Patient Instructions (Signed)
Follow up in 3-4 months to recheck diabetes We'll notify you of your lab results and make any changes if needed Try and schedule an eye exam when you have time Keep up the good work!  You are doing great! Call with any questions or concerns I'll keep you in my thoughts and prayers Happy Holidays!

## 2014-07-01 NOTE — Progress Notes (Signed)
Pre visit review using our clinic review tool, if applicable. No additional management support is needed unless otherwise documented below in the visit note. 

## 2014-07-01 NOTE — Progress Notes (Signed)
   Subjective:    Patient ID: Steve CanningClarence M Crail, male    DOB: 10-19-1940, 73 y.o.   MRN: [id number]  HPI HTN- chronic problem.  Adequate control on Lisinopril, Amlodipine, HCTZ, Coreg, Hydralazine.  No CP, SOB, HAs, visual changes, edema.  Hyperlipidemia- chronic problem, on Crestor.  Attempting to control w/ healthy diet.  No abd pain, N/V.  DM- chronic problem, on Metformin, Amaryl.  On ACE for renal protection.  Due for eye exam.  + neuropathy.  Denies symptomatic lows.  + diarrhea- going 4-5x/day, loose stools.   Review of Systems For ROS see HPI     Objective:   Physical Exam  Constitutional: He is oriented to person, place, and time. He appears well-developed and well-nourished. No distress.  obese  HENT:  Head: Normocephalic and atraumatic.  Eyes: Conjunctivae and EOM are normal. Pupils are equal, round, and reactive to light.  Neck: Normal range of motion. Neck supple. No thyromegaly present.  Cardiovascular: Normal rate, regular rhythm, normal heart sounds and intact distal pulses.   No murmur heard. Pulmonary/Chest: Effort normal and breath sounds normal. No respiratory distress.  Abdominal: Soft. Bowel sounds are normal. He exhibits no distension.  Musculoskeletal: He exhibits no edema.  Lymphadenopathy:    He has no cervical adenopathy.  Neurological: He is alert and oriented to person, place, and time. No cranial nerve deficit.  Skin: Skin is warm and dry.  Psychiatric: He has a normal mood and affect. His behavior is normal.  Vitals reviewed.         Assessment & Plan:

## 2014-07-01 NOTE — Assessment & Plan Note (Signed)
Chronic problem.  Adequate control.  Asymptomatic.  Check labs.  No anticipated med changes 

## 2014-07-01 NOTE — Assessment & Plan Note (Signed)
Chronic problem.  Having loose stools on Metformin but not true diarrhea.  Pt willing to tolerate this in order to have good sugar control.  Due for eye exam.  Encouraged him to schedule.  On ACE for renal protection.  Check labs.  Adjust meds prn

## 2014-07-26 ENCOUNTER — Other Ambulatory Visit: Payer: Self-pay | Admitting: General Practice

## 2014-07-26 MED ORDER — METFORMIN HCL 1000 MG PO TABS: 1000.0000 mg | ORAL_TABLET | Freq: Two times a day (BID) | ORAL | Status: DC

## 2014-08-02 ENCOUNTER — Other Ambulatory Visit: Payer: Self-pay | Admitting: Cardiovascular Disease

## 2014-08-17 ENCOUNTER — Other Ambulatory Visit: Payer: Self-pay | Admitting: Family Medicine

## 2014-08-17 NOTE — Telephone Encounter (Signed)
Med filled.  

## 2014-11-02 ENCOUNTER — Ambulatory Visit: Payer: Medicare HMO | Admitting: Family Medicine

## 2014-11-03 ENCOUNTER — Ambulatory Visit: Payer: Medicare HMO | Admitting: Family Medicine

## 2014-11-04 ENCOUNTER — Ambulatory Visit: Payer: Medicare HMO | Admitting: Family Medicine

## 2014-11-14 ENCOUNTER — Other Ambulatory Visit: Payer: Self-pay | Admitting: Family Medicine

## 2014-11-15 NOTE — Telephone Encounter (Signed)
Med filled.  

## 2014-11-17 ENCOUNTER — Encounter: Payer: Self-pay | Admitting: Family Medicine

## 2014-11-17 ENCOUNTER — Ambulatory Visit (INDEPENDENT_AMBULATORY_CARE_PROVIDER_SITE_OTHER): Payer: PPO | Admitting: Family Medicine

## 2014-11-17 VITALS — BP 122/78 | HR 54 | Temp 97.9°F | Resp 16 | Wt 234.4 lb

## 2014-11-17 DIAGNOSIS — E1140 Type 2 diabetes mellitus with diabetic neuropathy, unspecified: Secondary | ICD-10-CM

## 2014-11-17 DIAGNOSIS — Z23 Encounter for immunization: Secondary | ICD-10-CM

## 2014-11-17 LAB — BASIC METABOLIC PANEL
BUN: 9 mg/dL (ref 6–23)
CO2: 25 mEq/L (ref 19–32)
Calcium: 9 mg/dL (ref 8.4–10.5)
Chloride: 105 mEq/L (ref 96–112)
Creatinine, Ser: 1.44 mg/dL (ref 0.40–1.50)
GFR: 51.35 mL/min — ABNORMAL LOW (ref >60.00)
GLUCOSE: 150 mg/dL — AB (ref 70–99)
Potassium: 4.5 mEq/L (ref 3.5–5.1)
Sodium: 137 mEq/L (ref 135–145)

## 2014-11-17 LAB — HEMOGLOBIN A1C: HEMOGLOBIN A1C: 6.8 % — AB (ref 4.6–6.5)

## 2014-11-17 MED ORDER — GLIMEPIRIDE 4 MG PO TABS: 4.0000 mg | ORAL_TABLET | Freq: Every day | ORAL | Status: DC

## 2014-11-17 MED ORDER — METFORMIN HCL 1000 MG PO TABS: ORAL_TABLET | ORAL | Status: DC

## 2014-11-17 MED ORDER — OMEPRAZOLE 40 MG PO CPDR: 40.0000 mg | DELAYED_RELEASE_CAPSULE | Freq: Every evening | ORAL | Status: DC

## 2014-11-17 NOTE — Addendum Note (Signed)
Addended by: Jackson LatinoYLER, JESSICA L on: 11/17/2014 11:16 AM   Modules accepted: Orders

## 2014-11-17 NOTE — Assessment & Plan Note (Signed)
Chronic problem.  Adequate control previously.  On ACE for renal protection.  Due for eye exam- pt encouraged to schedule.  Check labs.  Adjust meds prn

## 2014-11-17 NOTE — Patient Instructions (Signed)
Follow up in 3-4 months to recheck diabetes and cholesterol We'll notify you of your lab results and make any changes if needed Get your eye exam at your convenience Call with any questions or concerns Keep up the good work! Hang in there!!!

## 2014-11-17 NOTE — Progress Notes (Signed)
   Subjective:    Patient ID: Steve Wiggins, male    DOB: 12/28/1940, 74 y.o.   MRN: [id number]  HPI DM- chronic problem, on Amaryl and metformin.  On ACE for renal protection.  Overdue for eye exam- [facility], [place].  Pt not checking CBGs regularly.  Pt has had sporadic symptomatic lows.  No numbness/tingling of hands/feet, 'sometimes they burn'.  No sores on feet.  No CP, SOB, HAs, visual changes, N/V/D.   Review of Systems For ROS see HPI     Objective:   Physical Exam  Constitutional: He is oriented to person, place, and time. He appears well-developed and well-nourished. No distress.  HENT:  Head: Normocephalic and atraumatic.  Eyes: Conjunctivae and EOM are normal. Pupils are equal, round, and reactive to light.  Neck: Normal range of motion. Neck supple. No thyromegaly present.  Cardiovascular: Normal rate, regular rhythm, normal heart sounds and intact distal pulses.   No murmur heard. Pulmonary/Chest: Effort normal and breath sounds normal. No respiratory distress.  Abdominal: Soft. Bowel sounds are normal. He exhibits no distension.  Musculoskeletal: He exhibits no edema.  Lymphadenopathy:    He has no cervical adenopathy.  Neurological: He is alert and oriented to person, place, and time. No cranial nerve deficit.  Skin: Skin is warm and dry.  Psychiatric: He has a normal mood and affect. His behavior is normal.  Vitals reviewed.         Assessment & Plan:

## 2014-11-17 NOTE — Progress Notes (Signed)
Pre visit review using our clinic review tool, if applicable. No additional management support is needed unless otherwise documented below in the visit note. 

## 2014-12-12 LAB — HM DIABETES EYE EXAM

## 2014-12-19 ENCOUNTER — Other Ambulatory Visit: Payer: Self-pay | Admitting: Cardiovascular Disease

## 2014-12-23 ENCOUNTER — Other Ambulatory Visit: Payer: Self-pay | Admitting: Cardiovascular Disease

## 2015-01-18 ENCOUNTER — Other Ambulatory Visit: Payer: Self-pay | Admitting: Cardiovascular Disease

## 2015-02-23 ENCOUNTER — Other Ambulatory Visit: Payer: Self-pay | Admitting: Family Medicine

## 2015-02-23 NOTE — Telephone Encounter (Signed)
Medication filled to pharmacy as requested.   

## 2015-03-14 ENCOUNTER — Telehealth: Payer: Self-pay | Admitting: General Practice

## 2015-03-14 ENCOUNTER — Other Ambulatory Visit: Payer: Self-pay | Admitting: Cardiovascular Disease

## 2015-03-14 ENCOUNTER — Ambulatory Visit (INDEPENDENT_AMBULATORY_CARE_PROVIDER_SITE_OTHER): Payer: PPO | Admitting: Family Medicine

## 2015-03-14 ENCOUNTER — Other Ambulatory Visit: Payer: Self-pay | Admitting: Family Medicine

## 2015-03-14 ENCOUNTER — Encounter: Payer: Self-pay | Admitting: Family Medicine

## 2015-03-14 VITALS — BP 124/80 | HR 54 | Temp 98.0°F | Resp 16 | Ht 69.0 in | Wt 233.4 lb

## 2015-03-14 DIAGNOSIS — B351 Tinea unguium: Secondary | ICD-10-CM | POA: Diagnosis not present

## 2015-03-14 DIAGNOSIS — E1140 Type 2 diabetes mellitus with diabetic neuropathy, unspecified: Secondary | ICD-10-CM | POA: Diagnosis not present

## 2015-03-14 DIAGNOSIS — E785 Hyperlipidemia, unspecified: Secondary | ICD-10-CM

## 2015-03-14 DIAGNOSIS — I10 Essential (primary) hypertension: Secondary | ICD-10-CM

## 2015-03-14 DIAGNOSIS — E038 Other specified hypothyroidism: Secondary | ICD-10-CM

## 2015-03-14 LAB — HEPATIC FUNCTION PANEL
ALT: 28 U/L (ref 0–53)
AST: 26 U/L (ref 0–37)
Albumin: 3.9 g/dL (ref 3.5–5.2)
Alkaline Phosphatase: 52 U/L (ref 39–117)
BILIRUBIN DIRECT: 0.1 mg/dL (ref 0.0–0.3)
BILIRUBIN TOTAL: 0.4 mg/dL (ref 0.2–1.2)
Total Protein: 6.6 g/dL (ref 6.0–8.3)

## 2015-03-14 LAB — LIPID PANEL
CHOL/HDL RATIO: 3
Cholesterol: 89 mg/dL (ref 0–200)
HDL: 26.3 mg/dL — ABNORMAL LOW (ref >39.00)
NONHDL: 62.85
Triglycerides: 250 mg/dL — ABNORMAL HIGH (ref 0.0–149.0)
VLDL: 50 mg/dL — ABNORMAL HIGH (ref 0.0–40.0)

## 2015-03-14 LAB — CBC WITH DIFFERENTIAL/PLATELET
BASOS PCT: 0.6 % (ref 0.0–3.0)
Basophils Absolute: 0 10*3/uL (ref 0.0–0.1)
EOS PCT: 3.3 % (ref 0.0–5.0)
Eosinophils Absolute: 0.2 10*3/uL (ref 0.0–0.7)
HCT: 39.2 % (ref 39.0–52.0)
HEMOGLOBIN: 13.6 g/dL (ref 13.0–17.0)
Lymphocytes Relative: 23.9 % (ref 12.0–46.0)
Lymphs Abs: 1.8 10*3/uL (ref 0.7–4.0)
MCHC: 34.8 g/dL (ref 30.0–36.0)
MCV: 88.9 fl (ref 78.0–100.0)
MONO ABS: 0.7 10*3/uL (ref 0.1–1.0)
Monocytes Relative: 8.8 % (ref 3.0–12.0)
Neutro Abs: 4.8 10*3/uL (ref 1.4–7.7)
Neutrophils Relative %: 63.4 % (ref 43.0–77.0)
Platelets: 151 10*3/uL (ref 150.0–400.0)
RBC: 4.41 Mil/uL (ref 4.22–5.81)
RDW: 13.5 % (ref 11.5–15.5)
WBC: 7.5 10*3/uL (ref 4.0–10.5)

## 2015-03-14 LAB — BASIC METABOLIC PANEL
BUN: 11 mg/dL (ref 6–23)
CHLORIDE: 106 meq/L (ref 96–112)
CO2: 25 mEq/L (ref 19–32)
CREATININE: 0.96 mg/dL (ref 0.40–1.50)
Calcium: 9 mg/dL (ref 8.4–10.5)
GFR: 81.91 mL/min (ref >60.00)
Glucose, Bld: 202 mg/dL — ABNORMAL HIGH (ref 70–99)
POTASSIUM: 5 meq/L (ref 3.5–5.1)
Sodium: 137 mEq/L (ref 135–145)

## 2015-03-14 LAB — HEMOGLOBIN A1C: Hgb A1c MFr Bld: 7.7 % — ABNORMAL HIGH (ref 4.6–6.5)

## 2015-03-14 LAB — LDL CHOLESTEROL, DIRECT: LDL DIRECT: 41 mg/dL

## 2015-03-14 LAB — TSH: TSH: 5.27 u[IU]/mL — ABNORMAL HIGH (ref 0.35–4.50)

## 2015-03-14 MED ORDER — TERBINAFINE HCL 250 MG PO TABS: 250.0000 mg | ORAL_TABLET | Freq: Every day | ORAL | Status: DC

## 2015-03-14 MED ORDER — LEVOTHYROXINE SODIUM 50 MCG PO TABS: 50.0000 ug | ORAL_TABLET | Freq: Every day | ORAL | Status: DC

## 2015-03-14 NOTE — Assessment & Plan Note (Signed)
Ongoing issue for pt.  He is unable to go to podiatry due to his role of caregiver for wife.  Due to extensive toenail fungus, will start systemic lamisil and have pt hold Crestor x12 weeks.  Pt expressed understanding and is in agreement w/ plan.

## 2015-03-14 NOTE — Assessment & Plan Note (Signed)
Chronic problem, tolerating statin w/o difficulty.  Will hold statin while pt takes 12 weeks of Lamisil for nail fungus.  Pt expressed understanding and is in agreement w/ plan.

## 2015-03-14 NOTE — Progress Notes (Signed)
Pre visit review using our clinic review tool, if applicable. No additional management support is needed unless otherwise documented below in the visit note. 

## 2015-03-14 NOTE — Assessment & Plan Note (Signed)
Chronic problem.  Well controlled.  Asymptomatic w/ exception of fatigue which is likely due to pt's high stress level in role of wife's caregiver (she is dying from dementia).  Check labs.  No anticipated med changes.  Will continue to follow.

## 2015-03-14 NOTE — Progress Notes (Signed)
   Subjective:    Patient ID: Steve Wiggins, male    DOB: 12-05-1940, 74 y.o.   MRN: [id number]  HPI DM- chronic problem, on Amaryl, Metformin.  On ACE for renal protection.  UTD on eye exam, foot exam.  Pt reports non-compliance w/ low carb diet and fluctuating sugars b/c of it.  Pt w/ extensive toenail fungus, 'it's been that way forever'.  No symptomatic lows.  Denies numbness/tingling hands/feet.  HTN- chronic problem, on Amlodipine, Coreg, Hydralazine, HCTZ, Lisinopril.  Good control today.  + SOB w/ exertion.  Has appt w/ Dr Excell Seltzer upcoming 10/17.  + fatigue.  Wife is completely dependent on pt as caregiver which is exhausting for pt.  No CP, HAs, visual changes, edema.  Hyperlipidemia- chronic problem, on Crestor.  Denies abd pain, N/V, myalgias.   Review of Systems For ROS see HPI     Objective:   Physical Exam  Constitutional: He is oriented to person, place, and time. He appears well-developed and well-nourished. No distress.  HENT:  Head: Normocephalic and atraumatic.  Eyes: Conjunctivae and EOM are normal. Pupils are equal, round, and reactive to light.  Neck: Normal range of motion. Neck supple. No thyromegaly present.  Cardiovascular: Normal rate, regular rhythm, normal heart sounds and intact distal pulses.   No murmur heard. Pulmonary/Chest: Effort normal and breath sounds normal. No respiratory distress.  Abdominal: Soft. Bowel sounds are normal. He exhibits no distension.  Musculoskeletal: He exhibits no edema.  Lymphadenopathy:    He has no cervical adenopathy.  Neurological: He is alert and oriented to person, place, and time. No cranial nerve deficit.  Skin: Skin is warm and dry.  Extensive fungal infxn of toenails  Psychiatric: He has a normal mood and affect. His behavior is normal.  Vitals reviewed.         Assessment & Plan:

## 2015-03-14 NOTE — Assessment & Plan Note (Signed)
Chronic problem.  Pt admits that diet has been poor and sugar has been elevated.  On ACE for renal protection.  UTD on eye exam, foot exam.  Denies symptomatic lows or numbness of hands/feet at this time.  Check labs.  Adjust meds prn

## 2015-03-14 NOTE — Telephone Encounter (Signed)
Pt called and informed of latest lab results. Labs ordered, pt scheduled and med filled to local pharmacy.    Notes Recorded by Sheliah Hatch, MD on 03/14/2015 at 4:41 PM TSH is now elevated- meaning thyroid is underfunctioning. Based on this, we need to start Levothyroxine daily and recheck TSH at lab visit in 1 month Also, triglycerides (fatty part of blood) and sugar are both elevated. This is due to dietary choices and will improve w/ healthy diet. A1C has increased from 6.8 --> 7.7 No med changes, but please work on healthy, low carb diet to improve numbers and continue metformin

## 2015-03-14 NOTE — Patient Instructions (Signed)
Schedule your complete physical in 3-4 months We'll notify you of your lab results and make any changes if needed START the Lamisil daily as directed.  HOLD the Crestor while taking the Lamisil for the next 12 weeks (restart Crestor when finished) Please try and take some time for yourself! Continue to work on healthy diet and regular activity as you are able Call with any questions or concerns Hang in there!!!

## 2015-03-19 ENCOUNTER — Other Ambulatory Visit: Payer: Self-pay | Admitting: Cardiovascular Disease

## 2015-04-18 ENCOUNTER — Other Ambulatory Visit (INDEPENDENT_AMBULATORY_CARE_PROVIDER_SITE_OTHER): Payer: PPO

## 2015-04-18 ENCOUNTER — Ambulatory Visit (INDEPENDENT_AMBULATORY_CARE_PROVIDER_SITE_OTHER): Payer: PPO | Admitting: Family Medicine

## 2015-04-18 ENCOUNTER — Encounter: Payer: Self-pay | Admitting: General Practice

## 2015-04-18 ENCOUNTER — Encounter: Payer: Self-pay | Admitting: Family Medicine

## 2015-04-18 VITALS — BP 134/80 | HR 67 | Temp 98.0°F | Resp 16 | Wt 227.1 lb

## 2015-04-18 DIAGNOSIS — E038 Other specified hypothyroidism: Secondary | ICD-10-CM | POA: Diagnosis not present

## 2015-04-18 DIAGNOSIS — F4321 Adjustment disorder with depressed mood: Secondary | ICD-10-CM | POA: Diagnosis not present

## 2015-04-18 LAB — TSH: TSH: 3.26 u[IU]/mL (ref 0.35–4.50)

## 2015-04-18 MED ORDER — CLONAZEPAM 0.5 MG PO TABS: 0.5000 mg | ORAL_TABLET | Freq: Two times a day (BID) | ORAL | Status: DC | PRN

## 2015-04-18 NOTE — Progress Notes (Signed)
Pre visit review using our clinic review tool, if applicable. No additional management support is needed unless otherwise documented below in the visit note. 

## 2015-04-18 NOTE — Progress Notes (Signed)
   Subjective:    Patient ID: Steve Wiggins, male    DOB: [birth year], 74 y.o.   MRN: [id number]  HPI Grief- pt's wife died 1 week ago.  He is clearly distraught by this.  He reports that prior to her passing, she did not eat for more than 20 days.  He is worried that he 'starved her to death'.  Wife had severe dementia.  Family was in town for funeral but they all left on Friday.  He spent the weekend alone- 'first time in 55 years'.  Pt is very anxious.  Very tearful.  Renato Gails is coming to speak w/ him tomorrow.  States he is sleeping at night.  Denies SI/HI.   Review of Systems For ROS see HPI     Objective:   Physical Exam  Constitutional: He is oriented to person, place, and time. He appears well-developed and well-nourished.  Tearful, clearly upset  Neurological: He is alert and oriented to person, place, and time.  Psychiatric: Judgment and thought content normal.  Tearful, anxious, clearly upset when talking about wife's death  Vitals reviewed.         Assessment & Plan:

## 2015-04-18 NOTE — Patient Instructions (Signed)
Follow up in 1-2 weeks to recheck anxiety Start the Clonazepam twice daily for this week and then as needed after that Please inquire about the Hospice grief counseling Please let me know if there is anything I can do to help.  We are here for you! Call with any questions or concerns I am so sorry for your loss.  You are in my thoughts and prayers.

## 2015-04-19 NOTE — Assessment & Plan Note (Signed)
New.  Pt is clearly deeply impacted by wife's recent death.  Discussed that he played no part in her death and he took excellent care of her.  Encouraged him to seek grief counseling through hospice.  Will start low dose clonazepam to improve anxiety.  Will follow closely.  Total time spent w/ pt, ~25 minutes, >50% spent counseling.

## 2015-04-26 ENCOUNTER — Other Ambulatory Visit: Payer: Self-pay | Admitting: Cardiovascular Disease

## 2015-04-27 ENCOUNTER — Encounter: Payer: Self-pay | Admitting: Family Medicine

## 2015-04-27 ENCOUNTER — Ambulatory Visit (INDEPENDENT_AMBULATORY_CARE_PROVIDER_SITE_OTHER): Payer: PPO | Admitting: Family Medicine

## 2015-04-27 VITALS — BP 136/82 | HR 58 | Temp 98.0°F | Resp 16 | Ht 69.0 in | Wt 233.1 lb

## 2015-04-27 DIAGNOSIS — Z23 Encounter for immunization: Secondary | ICD-10-CM

## 2015-04-27 DIAGNOSIS — F4321 Adjustment disorder with depressed mood: Secondary | ICD-10-CM | POA: Diagnosis not present

## 2015-04-27 DIAGNOSIS — I951 Orthostatic hypotension: Secondary | ICD-10-CM | POA: Insufficient documentation

## 2015-04-27 MED ORDER — GLUCOSE BLOOD VI STRP: ORAL_STRIP | Status: DC

## 2015-04-27 MED ORDER — ONETOUCH ULTRASOFT LANCETS MISC: Status: DC

## 2015-04-27 MED ORDER — TRAZODONE HCL 50 MG PO TABS: 25.0000 mg | ORAL_TABLET | Freq: Every evening | ORAL | Status: DC | PRN

## 2015-04-27 NOTE — Progress Notes (Signed)
Pre visit review using our clinic review tool, if applicable. No additional management support is needed unless otherwise documented below in the visit note. 

## 2015-04-27 NOTE — Patient Instructions (Signed)
Follow up in 1 month to recheck mood and sleep Start the Trazodone nightly- start w/ 1/2 tab nightly for sleep Increase your water intake- this is very important! Change positions slowly- allow your body time to adjust Decrease the Hydrochlorothiazide to 1/2 tab daily Continue the Clonopin as needed for the anxiety Call with any questions or concerns Hang in there!!!

## 2015-04-27 NOTE — Progress Notes (Signed)
   Subjective:    Patient ID: Steve Wiggins, male    DOB: 1941-01-01, 74 y.o.   MRN: [id number]  HPI Grief- 'i'm a little better than i was last time'.  Still crying but less so.  Pt took Clonazepam twice daily for 1st week and now once daily- 'i think it helps'.  Pt reports he has had intermittent dizzy spells- even prior to wife's passing- due to stress.  Pt reports he was sitting in the sun over the weekend and when he got up to go back in the house, he got very dizzy and fell.  Pt states these are not related to clonazepam.  Pt admits to poor water intake.  Pt has been taking wife's left over Ambien   Review of Systems For ROS see HPI     Objective:   Physical Exam  Constitutional: He is oriented to person, place, and time. He appears well-developed and well-nourished. No distress.  HENT:  Head: Normocephalic and atraumatic.  Eyes: Conjunctivae and EOM are normal. Pupils are equal, round, and reactive to light.  Neck: Normal range of motion. Neck supple. No thyromegaly present.  Cardiovascular: Normal rate, regular rhythm, normal heart sounds and intact distal pulses.   No murmur heard. Pulmonary/Chest: Effort normal and breath sounds normal. No respiratory distress.  Abdominal: Soft. Bowel sounds are normal. He exhibits no distension.  Musculoskeletal: He exhibits no edema.  Lymphadenopathy:    He has no cervical adenopathy.  Neurological: He is alert and oriented to person, place, and time. No cranial nerve deficit.  Skin: Skin is warm and dry.  Psychiatric: He has a normal mood and affect. His behavior is normal.  Vitals reviewed.         Assessment & Plan:

## 2015-05-01 NOTE — Assessment & Plan Note (Signed)
New.  Pt admits to poor water intake since wife's passing.  States his dizzy episodes occur w/ position changes and the most recent and impressive episode occurred after sitting in the sun and getting up to go inside.  Based on this, I will decrease his HCTZ to 1/2 tab daily and encouraged him to increase his water intake.  Reviewed supportive care and red flags that should prompt return.  Pt expressed understanding and is in agreement w/ plan.

## 2015-05-01 NOTE — Assessment & Plan Note (Addendum)
Pt's sxs are more manageable just 10 days since last visit.  He is weaning Alprazolam as directed.  Discussed that he should not be taking wife's Ambien as it has the potential for multiple side effects and addiction.  Due to insomnia, will start low dose trazodone.  Will follow closely.

## 2015-05-04 ENCOUNTER — Other Ambulatory Visit: Payer: Self-pay | Admitting: Cardiovascular Disease

## 2015-05-08 NOTE — Progress Notes (Signed)
Cardiology Office Note Date:  05/09/2015   ID:  Steve Wiggins, DOB Dec 15, 1940, MRN [id number]  PCP:  Neena Rhymes, MD  Cardiologist:  Tonny Bollman, MD    Chief Complaint  Patient presents with  . Coronary Artery Disease    History of Present Illness: Steve Wiggins is a 74 y.o. male who presents for followup evaluation. The patient is followed for coronary artery disease and hypertension. He was last seen one year ago.   The patient's wife passed away one month ago. He is tearful today - having a tough time. They were married 53 years. He has shortness of breath with activity, otherwise no complaints. No chest pain or pressure with activity. No leg swelling, orthopnea, or PND. He does complain of postural dizziness. Has reduced diuretic to 1/2 of prescribed dose. No other complaints today.  Past Medical History  Diagnosis Date  . Diabetes mellitus     type 2  . Hypertension   . Obesity   . Coronary artery disease     a. 07/2010 echo: nl EF;  b. 08/2010 Cath/PCI: Resolute DES to OM  . Mild carotid artery disease (HCC)     a. 07/2010 Carotid U/S: bilat ICA 0-35%   . Hyperlipidemia   . History of head injury FELL OFF LADDER,  1999--  S/P DRAINAGE SUBDERMAL HEMATOMA--  NO RESIDUAL  . GERD (gastroesophageal reflux disease)   . Umbilical hernia   . History of gastric ulcer   . Penile lesion   . History of gout     Past Surgical History  Procedure Laterality Date  . Cardiovascular stress test  08-28-2010  ([place], [place])    EF 66%/ SMALL AREA PROXIMAL POSTEROLATERAL ISCHEMIA  . Coronary angioplasty with stent placement  09-26-2011  ([place], [place])    DRUG-ELUTING STENT X1 TO FIRST OBTUSE MARGINAL BRANCH CIRCUMFLEX/ MODERATE LESION DIAGINAL BRANCH TREATED MEDICALLY  . Surg for drainage subdural  hematoma  1999  . Transthoracic echocardiogram  08-21-2010    LVF NORMAL/ MILD MITRAL AND TRICUSID REGURG.  . Cholecystectomy  06/05/2012    Procedure: LAPAROSCOPIC  CHOLECYSTECTOMY;  Surgeon: Emelia Loron, MD;  Location: [facility] OR;  Service: General;  Laterality: N/A;  . Umbilical hernia repair  06/05/2012    Procedure: HERNIA REPAIR UMBILICAL ADULT;  Surgeon: Emelia Loron, MD;  Location: [facility] OR;  Service: General;  Laterality: N/A;  Primary Umbilical Hernia Repair    Current Outpatient Prescriptions  Medication Sig Dispense Refill  . amLODipine (NORVASC) 5 MG tablet TAKE 1 TABLET BY MOUTH EVERY MORNING 90 tablet 0  . aspirin 81 MG tablet Take 81 mg by mouth every morning.     . carvedilol (COREG) 25 MG tablet TAKE 1 TABLET TWICE A DAY WITH MEALS 180 tablet 3  . clonazePAM (KLONOPIN) 0.5 MG tablet Take 1 tablet (0.5 mg total) by mouth 2 (two) times daily as needed for anxiety. 60 tablet 0  . glimepiride (AMARYL) 4 MG tablet Take 1 tablet (4 mg total) by mouth daily before breakfast. 90 tablet 1  . glucose blood test strip Use to check sugars twice daily. Dx E11.40 100 each 12  . hydrALAZINE (APRESOLINE) 50 MG tablet TAKE 1 TABLET BY MOUTH 3 TIMES DAILY 270 tablet 0  . hydrochlorothiazide (HYDRODIURIL) 25 MG tablet Take 12.5 mg by mouth daily.    . IBUPROFEN PO Take by mouth. PRN FOR PAIN    . Lancets (ONETOUCH ULTRASOFT) lancets Pt tests sugars twice daily.Dx. E11.40 100 each 12  .  levothyroxine (SYNTHROID, LEVOTHROID) 50 MCG tablet Take 1 tablet (50 mcg total) by mouth daily. 90 tablet 3  . lisinopril (PRINIVIL,ZESTRIL) 20 MG tablet TAKE 2 TABLETS BY MOUTH EVERY MORNING 180 tablet 0  . metFORMIN (GLUCOPHAGE) 1000 MG tablet TAKE 1 TABLET (1,000 MG TOTAL) BY MOUTH 2 (TWO) TIMES DAILY. 180 tablet 1  . omeprazole (PRILOSEC) 40 MG capsule Take 1 capsule (40 mg total) by mouth every evening. 90 capsule 1  . tamsulosin (FLOMAX) 0.4 MG CAPS capsule Take 0.8 mg by mouth daily after supper.     . terbinafine (LAMISIL) 250 MG tablet Take 1 tablet (250 mg total) by mouth daily. 90 tablet 0  . traZODone (DESYREL) 50 MG tablet Take 0.5-1 tablets (25-50 mg total)  by mouth at bedtime as needed for sleep. 30 tablet 3  . CRESTOR 10 MG tablet TAKE 1 TABLET BY MOUTH EVERY DAY (Patient not taking: Reported on 05/09/2015) 90 tablet 1   No current facility-administered medications for this visit.    Allergies:   Review of patient's allergies indicates no known allergies.   Social History:  The patient  reports that he has quit smoking. He has never used smokeless tobacco. He reports that he does not drink alcohol or use illicit drugs.   Family History:  The patient's  family history includes Heart attack in his maternal grandmother and mother; Stroke in his mother.   ROS:  Please see the history of present illness.  Otherwise, review of systems is positive for muscle pain, dizziness, snoring, anxiety, difficulty urinating.  All other systems are reviewed and negative.    PHYSICAL EXAM: VS:  BP 160/78 mmHg  Pulse 73  Ht 5\' 7"  (1.702 m)  Wt 229 lb 1.9 oz (103.928 kg)  BMI 35.88 kg/m2 , BMI Body mass index is 35.88 kg/(m^2). GEN: Well nourished, well developed, overweight male in no acute distress HEENT: normal Neck: no JVD, no masses. No carotid bruits Cardiac: RRR without murmur or gallop                Respiratory:  clear to auscultation bilaterally, normal work of breathing GI: soft, nontender, nondistended, + BS MS: no deformity or atrophy Ext: no pretibial edema, pedal pulses 2+= bilaterally Skin: warm and dry, no rash Neuro:  Strength and sensation are intact Psych: euthymic mood, full affect  EKG:  EKG is ordered today. The ekg ordered today shows NSR 73 bpm, within normal limits  Recent Labs: 03/14/2015: ALT 28; BUN 11; Creatinine, Ser 0.96; Hemoglobin 13.6; Platelets 151.0; Potassium 5.0; Sodium 137 04/18/2015: TSH 3.26   Lipid Panel     Component Value Date/Time   CHOL 89 03/14/2015 1008   TRIG 250.0* 03/14/2015 1008   HDL 26.30* 03/14/2015 1008   CHOLHDL 3 03/14/2015 1008   VLDL 50.0* 03/14/2015 1008   LDLCALC 37 07/01/2014 0904    LDLDIRECT 41.0 03/14/2015 1008      Wt Readings from Last 3 Encounters:  05/09/15 229 lb 1.9 oz (103.928 kg)  04/27/15 233 lb 2 oz (105.745 kg)  04/18/15 227 lb 2 oz (103.023 kg)    ASSESSMENT AND PLAN: 1.  CAD, native vessel:  No symptoms of angina. Will continue his current medical program.  2. Essential hypertension:  Blood pressure elevated today. He is very emotional after the recent death of his wife. I did not make any changes in his medications. He is taking a combination of amlodipine, carvedilol , hydralazine, hydrochlorothiazide , and lisinopril. He already is having  some issues with postural dizziness. Focused on lifestyle modification in our discussion today.  3. Hyperlipidemia:  Lipids reviewed as above. Hypertriglyceridemia noted. Lifestyle modification reviewed in detail. I encouraged him to exercise for 40 minutes every day. I think this will help both with his physical and emotional health.   Current medicines are reviewed with the patient today.  The patient does not have concerns regarding medicines.  Labs/ tests ordered today include:   Orders Placed This Encounter  Procedures  . EKG 12-Lead    Disposition:   FU one year  Signed, Tonny Bollman, MD  05/09/2015 11:52 AM    [facility] [address] [place], [place], [place]  [address] Phone: [phone]; Fax: [phone]

## 2015-05-09 ENCOUNTER — Encounter: Payer: Self-pay | Admitting: Cardiovascular Disease

## 2015-05-09 ENCOUNTER — Ambulatory Visit (INDEPENDENT_AMBULATORY_CARE_PROVIDER_SITE_OTHER): Payer: PPO | Admitting: Cardiovascular Disease

## 2015-05-09 VITALS — BP 160/78 | HR 73 | Ht 67.0 in | Wt 229.1 lb

## 2015-05-09 DIAGNOSIS — E785 Hyperlipidemia, unspecified: Secondary | ICD-10-CM | POA: Diagnosis not present

## 2015-05-09 DIAGNOSIS — I2510 Atherosclerotic heart disease of native coronary artery without angina pectoris: Secondary | ICD-10-CM | POA: Diagnosis not present

## 2015-05-09 NOTE — Patient Instructions (Signed)

## 2015-05-17 ENCOUNTER — Other Ambulatory Visit: Payer: Self-pay | Admitting: Family Medicine

## 2015-05-18 NOTE — Telephone Encounter (Signed)
Last OV 04/27/15 Clonazepam last filled 04/18/15 #60 with 0

## 2015-05-18 NOTE — Telephone Encounter (Signed)
Medication filled to pharmacy as requested.   

## 2015-05-25 ENCOUNTER — Other Ambulatory Visit: Payer: Self-pay | Admitting: Cardiovascular Disease

## 2015-05-31 ENCOUNTER — Ambulatory Visit: Payer: PPO | Admitting: Family Medicine

## 2015-06-09 ENCOUNTER — Encounter: Payer: Self-pay | Admitting: Family Medicine

## 2015-06-09 ENCOUNTER — Ambulatory Visit (INDEPENDENT_AMBULATORY_CARE_PROVIDER_SITE_OTHER): Payer: PPO | Admitting: Family Medicine

## 2015-06-09 ENCOUNTER — Encounter (INDEPENDENT_AMBULATORY_CARE_PROVIDER_SITE_OTHER): Payer: Self-pay

## 2015-06-09 VITALS — BP 132/72 | HR 82 | Temp 97.9°F | Resp 16 | Wt 225.4 lb

## 2015-06-09 DIAGNOSIS — L989 Disorder of the skin and subcutaneous tissue, unspecified: Secondary | ICD-10-CM | POA: Diagnosis not present

## 2015-06-09 DIAGNOSIS — I951 Orthostatic hypotension: Secondary | ICD-10-CM

## 2015-06-09 DIAGNOSIS — F4321 Adjustment disorder with depressed mood: Secondary | ICD-10-CM

## 2015-06-09 NOTE — Progress Notes (Signed)
Pre visit review using our clinic review tool, if applicable. No additional management support is needed unless otherwise documented below in the visit note. 

## 2015-06-09 NOTE — Assessment & Plan Note (Signed)
Improved since decreasing HCTZ to 1/2 tab.  BP remains well controlled.  Will continue to follow.

## 2015-06-09 NOTE — Assessment & Plan Note (Signed)
Slowly improving.  Pt is able to space out his Klonopin use.  Using Trazodone nightly for sleep.  Discussed that the holidays would be difficult and encouraged him to surround himself w/ family and avoid being alone.  Pt expressed understanding and is in agreement w/ plan.

## 2015-06-09 NOTE — Patient Instructions (Signed)
Follow up as scheduled in Feb Continue the 1/2 tab of hydrochlorothiazide for BP- it looks great! Use the Trazodone as needed for sleep Take the Clonazepam as needed for the high anxiety moments- use it as you need it! We'll call you with your dermatology appt Call with any questions or concerns If you want to join us at the new [place] office, any scheduled appointments will automatically transfer and we will see you at [address] Steve Wiggins, [place], [place] [id number] (OPENING 07/26/15) Happy Holidays!  I know they will be hard, but you can do it!

## 2015-06-09 NOTE — Assessment & Plan Note (Signed)
New.  Appears to be a precancerous lesion.  Refer to derm for complete evaluation and tx.

## 2015-06-09 NOTE — Progress Notes (Signed)
   Subjective:    Patient ID: Steve CanningClarence M Stoecker, male    DOB: September 24, 1940, 74 y.o.   MRN: [id number]  HPI Grief- 'i think i'm doing better'.  Took a trip w/ son to [place] to visit old friends.  'i still break down and cry every day a little bit'.  Taking Trazodone for sleep.  Has not taken Clonazepam in 'about a week'.  Skin lesion- R top of head, 'it's like a wart that keeps growing'.  HTN- chronic problem, pt has decreased HCTZ to 1/2 tab as directed.  Reports dizzy spells are less frequent since decreasing meds and drinking more water.   Review of Systems For ROS see HPI     Objective:   Physical Exam  Constitutional: He is oriented to person, place, and time. He appears well-developed and well-nourished.  HENT:  Head: Normocephalic and atraumatic.  Keratotic skin lesion on R scalp on top of head w/o surrounding erythema or induration  Eyes: Conjunctivae and EOM are normal. Pupils are equal, round, and reactive to light.  Cardiovascular: Normal rate and regular rhythm.   Pulmonary/Chest: Effort normal and breath sounds normal. No respiratory distress. He has no wheezes. He has no rales.  Neurological: He is alert and oriented to person, place, and time.  Skin: Skin is warm and dry. No erythema.  Psychiatric:  Intermittently tearful when speaking about his wife  Vitals reviewed.         Assessment & Plan:

## 2015-06-11 ENCOUNTER — Other Ambulatory Visit: Payer: Self-pay | Admitting: Family Medicine

## 2015-06-13 NOTE — Telephone Encounter (Signed)
Medication filled to pharmacy as requested.   

## 2015-06-15 ENCOUNTER — Other Ambulatory Visit: Payer: Self-pay | Admitting: General Practice

## 2015-06-15 MED ORDER — TRAZODONE HCL 50 MG PO TABS: 25.0000 mg | ORAL_TABLET | Freq: Every evening | ORAL | Status: DC | PRN

## 2015-06-21 ENCOUNTER — Other Ambulatory Visit: Payer: Self-pay | Admitting: Cardiovascular Disease

## 2015-06-28 ENCOUNTER — Telehealth: Payer: Self-pay | Admitting: Family Medicine

## 2015-06-28 ENCOUNTER — Telehealth: Payer: Self-pay | Admitting: General Practice

## 2015-06-28 DIAGNOSIS — L989 Disorder of the skin and subcutaneous tissue, unspecified: Secondary | ICD-10-CM

## 2015-06-28 NOTE — Telephone Encounter (Signed)
Caller name: Self   Can be reached: [phone]  Reason for call: Patient wish to speak with Shanda BumpsJessica about Dermatologist that he was referred to.

## 2015-06-28 NOTE — Telephone Encounter (Signed)
Patient is going to call Dr Rocky LinkSzabo and self schedule

## 2015-06-28 NOTE — Telephone Encounter (Signed)
Spoke with pt and he advised that he has medicaid and his normal insurance. Pt states he went to dermatology referral today and was advised that they would not see him due to the [employer] insurance. They gave him the name of the following provider, [facility] Dr. Joanna PuffJames Szabo is this correct? Pt is bringing in his card to be scanned in to his chart today.

## 2015-06-28 NOTE — Telephone Encounter (Signed)
Called pt in regards to another question and he advised that he is still having sever diarrhea. Said he had discussed it with PCP in the past and is wondering what he should do since the OTC medications are not working.

## 2015-06-28 NOTE — Telephone Encounter (Signed)
Pt came in office and [place] [employer] [place]. (Copy on file)

## 2015-06-28 NOTE — Telephone Encounter (Signed)
Pt should decrease Metformin to 500mg  BID (1/2 tab) and see if diarrhea improves

## 2015-06-29 NOTE — Telephone Encounter (Signed)
Referral faxed to [facility] 

## 2015-06-29 NOTE — Telephone Encounter (Signed)
Spoke with pt and informed of the results. Pt stated an understanding. Pt also said that he would like for dermatology referral to be in  since that is who his wife seen and they accept medicaid. Referral placed.

## 2015-07-18 ENCOUNTER — Other Ambulatory Visit: Payer: Self-pay | Admitting: Family Medicine

## 2015-07-19 NOTE — Telephone Encounter (Signed)
Medication filled to pharmacy as requested.   

## 2015-07-26 ENCOUNTER — Other Ambulatory Visit: Payer: Self-pay | Admitting: Family Medicine

## 2015-07-26 NOTE — Telephone Encounter (Signed)
Medication filled to pharmacy as requested.   

## 2015-08-11 ENCOUNTER — Other Ambulatory Visit: Payer: Self-pay | Admitting: Cardiovascular Disease

## 2015-08-20 ENCOUNTER — Other Ambulatory Visit: Payer: Self-pay | Admitting: Cardiovascular Disease

## 2015-09-12 ENCOUNTER — Encounter: Payer: Self-pay | Admitting: General Practice

## 2015-09-12 ENCOUNTER — Encounter: Payer: Self-pay | Admitting: Family Medicine

## 2015-09-12 ENCOUNTER — Ambulatory Visit (INDEPENDENT_AMBULATORY_CARE_PROVIDER_SITE_OTHER): Payer: PPO | Admitting: Family Medicine

## 2015-09-12 VITALS — BP 142/76 | HR 53 | Temp 97.8°F | Ht 67.0 in | Wt 224.8 lb

## 2015-09-12 DIAGNOSIS — Z125 Encounter for screening for malignant neoplasm of prostate: Secondary | ICD-10-CM | POA: Diagnosis not present

## 2015-09-12 DIAGNOSIS — L989 Disorder of the skin and subcutaneous tissue, unspecified: Secondary | ICD-10-CM

## 2015-09-12 DIAGNOSIS — E1140 Type 2 diabetes mellitus with diabetic neuropathy, unspecified: Secondary | ICD-10-CM

## 2015-09-12 DIAGNOSIS — Z0000 Encounter for general adult medical examination without abnormal findings: Secondary | ICD-10-CM | POA: Diagnosis not present

## 2015-09-12 DIAGNOSIS — I10 Essential (primary) hypertension: Secondary | ICD-10-CM

## 2015-09-12 DIAGNOSIS — E785 Hyperlipidemia, unspecified: Secondary | ICD-10-CM

## 2015-09-12 LAB — BASIC METABOLIC PANEL
BUN: 22 mg/dL (ref 6–23)
CALCIUM: 9.3 mg/dL (ref 8.4–10.5)
CHLORIDE: 101 meq/L (ref 96–112)
CO2: 28 meq/L (ref 19–32)
CREATININE: 0.92 mg/dL (ref 0.40–1.50)
GFR: 85.91 mL/min (ref >60.00)
GLUCOSE: 198 mg/dL — AB (ref 70–99)
Potassium: 4.5 mEq/L (ref 3.5–5.1)
Sodium: 137 mEq/L (ref 135–145)

## 2015-09-12 LAB — HEPATIC FUNCTION PANEL
ALT: 17 U/L (ref 0–53)
AST: 15 U/L (ref 0–37)
Albumin: 4.3 g/dL (ref 3.5–5.2)
Alkaline Phosphatase: 43 U/L (ref 39–117)
BILIRUBIN TOTAL: 0.5 mg/dL (ref 0.2–1.2)
Bilirubin, Direct: 0.1 mg/dL (ref 0.0–0.3)
TOTAL PROTEIN: 6.6 g/dL (ref 6.0–8.3)

## 2015-09-12 LAB — HEMOGLOBIN A1C: HEMOGLOBIN A1C: 7.3 % — AB (ref 4.6–6.5)

## 2015-09-12 LAB — CBC WITH DIFFERENTIAL/PLATELET
BASOS ABS: 0 10*3/uL (ref 0.0–0.1)
BASOS PCT: 0.4 % (ref 0.0–3.0)
EOS ABS: 0.2 10*3/uL (ref 0.0–0.7)
Eosinophils Relative: 2.6 % (ref 0.0–5.0)
HCT: 40.6 % (ref 39.0–52.0)
HEMOGLOBIN: 14.2 g/dL (ref 13.0–17.0)
Lymphocytes Relative: 27.3 % (ref 12.0–46.0)
Lymphs Abs: 2 10*3/uL (ref 0.7–4.0)
MCHC: 35 g/dL (ref 30.0–36.0)
MCV: 88.9 fl (ref 78.0–100.0)
MONO ABS: 0.6 10*3/uL (ref 0.1–1.0)
Monocytes Relative: 8.1 % (ref 3.0–12.0)
Neutro Abs: 4.5 10*3/uL (ref 1.4–7.7)
Neutrophils Relative %: 61.6 % (ref 43.0–77.0)
PLATELETS: 135 10*3/uL — AB (ref 150.0–400.0)
RBC: 4.56 Mil/uL (ref 4.22–5.81)
RDW: 13.6 % (ref 11.5–15.5)
WBC: 7.3 10*3/uL (ref 4.0–10.5)

## 2015-09-12 LAB — LIPID PANEL
Cholesterol: 113 mg/dL (ref 0–200)
HDL: 29.2 mg/dL — AB (ref >39.00)
NonHDL: 84.06
TRIGLYCERIDES: 208 mg/dL — AB (ref 0.0–149.0)
Total CHOL/HDL Ratio: 4
VLDL: 41.6 mg/dL — ABNORMAL HIGH (ref 0.0–40.0)

## 2015-09-12 LAB — LDL CHOLESTEROL, DIRECT: LDL DIRECT: 54 mg/dL

## 2015-09-12 LAB — PSA, MEDICARE: PSA: 1.44 ng/mL (ref 0.10–4.00)

## 2015-09-12 LAB — TSH: TSH: 3.36 u[IU]/mL (ref 0.35–4.50)

## 2015-09-12 MED ORDER — TRIAMCINOLONE ACETONIDE 0.1 % EX OINT: 1.0000 "application " | TOPICAL_OINTMENT | Freq: Two times a day (BID) | CUTANEOUS | Status: DC

## 2015-09-12 NOTE — Progress Notes (Signed)
Pre visit review using our clinic review tool, if applicable. No additional management support is needed unless otherwise documented below in the visit note. 

## 2015-09-12 NOTE — Progress Notes (Signed)
   Subjective:    Patient ID: Steve Wiggins, male    DOB: February 22, 1941, 75 y.o.   MRN: [id number]  HPI Here today for CPE.  Risk Factors: HTN- chronic problem, on Amlodipine, Coreg, Hydralazine, HCTZ, Lisinopril.  BP is mildly elevated today but he is tearful while discussing wife. Hyperlipidemia- chronic problem, on Crestor  DM- chronic problem, on Metformin and Amaryl.  UTD on foot exam and eye exam.  On ACE for renal protection.  Due to diarrhea, pt had to decrease metformin to 1/2 tab BID.  Pt is concerned b/c home CBGs are now higher than previously- around 200. Physical Activity: walking regularly Fall Risk: mildly elevated due to orthostasis Depression: continues to grieve his wife's death.  Currently on Trazodone and Klonopin prn. Hearing: decreased to conversational tones and whispered voice ADL's: independent Cognitive: normal linear thought process, memory and attention intact Home Safety: safe at home, now lives alone Height, Weight, BMI, Visual Acuity: see vitals, vision corrected to 20/20 w/ glasses Counseling: UTD on vaccines, has never had colonoscopy- 'i don't want to'.  Has not seen Dr Vernie Ammons recently Care team reviewed and updated w/ pt Labs Ordered: See A&P Care Plan: See A&P    Review of Systems Patient reports no vision/hearing changes, anorexia, fever ,adenopathy, persistant/recurrent hoarseness, swallowing issues, chest pain, palpitations, edema, persistant/recurrent cough, hemoptysis, dyspnea (rest,exertional, paroxysmal nocturnal), gastrointestinal  bleeding (melena, rectal bleeding), abdominal pain, excessive heart burn, GU symptoms (dysuria, hematuria, voiding/incontinence issues) syncope, focal weakness, memory loss, numbness & tingling, skin/hair/nail changes, abnormal bruising/bleeding, musculoskeletal symptoms/signs.      Objective:   Physical Exam General Appearance:    Alert, cooperative, no distress, appears stated age  Head:    Normocephalic,  without obvious abnormality, atraumatic  Eyes:    PERRL, conjunctiva/corneas clear, EOM's intact, fundi    benign, both eyes       Ears:    Normal TM's and external ear canals, both ears  Nose:   Nares normal, septum midline, mucosa normal, no drainage   or sinus tenderness  Throat:   Lips, mucosa, and tongue normal; teeth and gums normal  Neck:   Supple, symmetrical, trachea midline, no adenopathy;       thyroid:  No enlargement/tenderness/nodules  Back:     Symmetric, no curvature, ROM normal, no CVA tenderness  Lungs:     Clear to auscultation bilaterally, respirations unlabored  Chest wall:    No tenderness or deformity  Heart:    Regular rate and rhythm, S1 and S2 normal, no murmur, rub   or gallop  Abdomen:     Soft, non-tender, bowel sounds active all four quadrants,    no masses, no organomegaly  Genitalia:    Deferred at pt's request  Rectal:    Extremities:   Extremities normal, atraumatic, no cyanosis or edema  Pulses:   2+ and symmetric all extremities  Skin:   Skin color, texture, turgor normal, no rashes or lesions  Lymph nodes:   Cervical, supraclavicular, and axillary nodes normal  Neurologic:   CNII-XII intact. Normal strength, sensation and reflexes      throughout          Assessment & Plan:

## 2015-09-12 NOTE — Patient Instructions (Signed)
Follow up in 3-4 months to recheck diabetes We'll notify you of your lab results and make any changes if needed Continue to work on healthy diet and regular walking You are due for your eye exam in late May- please schedule this We will call you with your Dermatology appt Let me know if you change your mind on the colonoscopy Use the triamcinolone ointment twice daily as needed for the rash on your neck Call with any questions or concerns Hang in there!! If you want to join [place] at the new [place] office, any scheduled appointments will automatically transfer and we will see you at [address] [place] [address] [place], [place], [place] [address] [facility]) Have a great week!!!

## 2015-09-13 NOTE — Assessment & Plan Note (Signed)
Chronic problem.  UTD on foot exam, eye exam.  On ACE for renal protection.  Stressed importance of low carb diet and regular walking.  Check labs.  Adjust meds prn

## 2015-09-13 NOTE — Assessment & Plan Note (Signed)
Chronic problem.  Tolerating Crestor w/o difficulty.  Check labs.  Adjust meds prn  

## 2015-09-13 NOTE — Assessment & Plan Note (Signed)
Pt needs referral to return to dermatology.

## 2015-09-13 NOTE — Assessment & Plan Note (Signed)
Pt's PE unchanged from previous.  He has never had colonoscopy and declines at this time, stating, 'if i got cancer I could die and be with Bonita Quin'.  Has not seen urology recently- will get PSA.  UTD on immunizations.  Written screening schedule updated and given to pt. Check labs.  Anticipatory guidance provided.

## 2015-09-13 NOTE — Assessment & Plan Note (Signed)
Chronic problem.  Adequate but not ideal control today but pt is tearful while discussing his wife's death.  Currently asymptomatic.  Check labs.  No anticipated med changes.  Will continue to follow closely.

## 2015-09-21 DIAGNOSIS — L578 Other skin changes due to chronic exposure to nonionizing radiation: Secondary | ICD-10-CM | POA: Diagnosis not present

## 2015-09-21 DIAGNOSIS — L821 Other seborrheic keratosis: Secondary | ICD-10-CM | POA: Diagnosis not present

## 2015-09-21 DIAGNOSIS — L820 Inflamed seborrheic keratosis: Secondary | ICD-10-CM | POA: Diagnosis not present

## 2015-10-19 ENCOUNTER — Other Ambulatory Visit: Payer: Self-pay | Admitting: Cardiovascular Disease

## 2015-10-20 DIAGNOSIS — N138 Other obstructive and reflux uropathy: Secondary | ICD-10-CM | POA: Diagnosis not present

## 2015-10-20 DIAGNOSIS — N401 Enlarged prostate with lower urinary tract symptoms: Secondary | ICD-10-CM | POA: Diagnosis not present

## 2015-10-20 DIAGNOSIS — Z0000 Encounter for general adult medical examination without abnormal findings: Secondary | ICD-10-CM | POA: Diagnosis not present

## 2015-11-15 DIAGNOSIS — H6123 Impacted cerumen, bilateral: Secondary | ICD-10-CM | POA: Diagnosis not present

## 2015-11-28 ENCOUNTER — Other Ambulatory Visit: Payer: Self-pay | Admitting: General Practice

## 2015-11-28 MED ORDER — ROSUVASTATIN CALCIUM 10 MG PO TABS: 10.0000 mg | ORAL_TABLET | Freq: Every day | ORAL | Status: DC

## 2015-12-12 ENCOUNTER — Other Ambulatory Visit: Payer: Self-pay | Admitting: General Practice

## 2015-12-12 MED ORDER — GLIMEPIRIDE 4 MG PO TABS: ORAL_TABLET | ORAL | Status: DC

## 2015-12-14 ENCOUNTER — Other Ambulatory Visit: Payer: Self-pay | Admitting: General Practice

## 2015-12-14 MED ORDER — TRAZODONE HCL 50 MG PO TABS: 25.0000 mg | ORAL_TABLET | Freq: Every evening | ORAL | Status: DC | PRN

## 2016-01-10 ENCOUNTER — Encounter: Payer: Self-pay | Admitting: Family Medicine

## 2016-01-10 ENCOUNTER — Ambulatory Visit: Payer: PPO | Admitting: Family Medicine

## 2016-01-10 ENCOUNTER — Ambulatory Visit (INDEPENDENT_AMBULATORY_CARE_PROVIDER_SITE_OTHER): Payer: PPO | Admitting: Family Medicine

## 2016-01-10 ENCOUNTER — Encounter: Payer: Self-pay | Admitting: General Practice

## 2016-01-10 VITALS — BP 130/82 | HR 50 | Temp 98.1°F | Resp 17 | Ht 67.0 in | Wt 223.5 lb

## 2016-01-10 DIAGNOSIS — E1140 Type 2 diabetes mellitus with diabetic neuropathy, unspecified: Secondary | ICD-10-CM | POA: Diagnosis not present

## 2016-01-10 LAB — BASIC METABOLIC PANEL
BUN: 18 mg/dL (ref 6–23)
CHLORIDE: 104 meq/L (ref 96–112)
CO2: 27 mEq/L (ref 19–32)
Calcium: 9.3 mg/dL (ref 8.4–10.5)
Creatinine, Ser: 0.92 mg/dL (ref 0.40–1.50)
GFR: 85.83 mL/min (ref >60.00)
Glucose, Bld: 171 mg/dL — ABNORMAL HIGH (ref 70–99)
POTASSIUM: 4.3 meq/L (ref 3.5–5.1)
Sodium: 137 mEq/L (ref 135–145)

## 2016-01-10 LAB — HEMOGLOBIN A1C: HEMOGLOBIN A1C: 7.3 % — AB (ref 4.6–6.5)

## 2016-01-10 NOTE — Assessment & Plan Note (Signed)
Chronic problem.  Pt reports sugars have been high but these are not fasting #s.  Due for eye exam- referral placed.  Foot exam done today. On ACE for renal protection.  Discussed need for healthy diet and regular exercise.  Check labs.  Adjust meds prn

## 2016-01-10 NOTE — Progress Notes (Signed)
   Subjective:    Patient ID: Steve CanningClarence M Byrum, male    DOB: 14-May-1941, 75 y.o.   MRN: [id number]  HPI DM- chronic problem, on Metformin twice daily and Amaryl.  Due for foot exam, eye exam.  On ACE for renal protection.  Pt has lost some weight since last visit.  Pt reports CBGs are typically running ~200 throughout the day.  Denies symptomatic lows.  Pt continues to have intermittent diarrhea.  Due for an eye exam- needs referral.  Denies CP, SOB, HAs, visual changes, abd pain.  + neuropathy of R foot.   Review of Systems For ROS see HPI     Objective:   Physical Exam  Constitutional: He is oriented to person, place, and time. He appears well-developed and well-nourished. No distress.  HENT:  Head: Normocephalic and atraumatic.  Eyes: Conjunctivae and EOM are normal. Pupils are equal, round, and reactive to light.  Neck: Normal range of motion. Neck supple. No thyromegaly present.  Cardiovascular: Normal rate, regular rhythm, normal heart sounds and intact distal pulses.   No murmur heard. Pulmonary/Chest: Effort normal and breath sounds normal. No respiratory distress.  Abdominal: Soft. Bowel sounds are normal. He exhibits no distension.  Musculoskeletal: He exhibits no edema.  Lymphadenopathy:    He has no cervical adenopathy.  Neurological: He is alert and oriented to person, place, and time. No cranial nerve deficit.  Skin: Skin is warm and dry.  Psychiatric: He has a normal mood and affect. His behavior is normal.  Vitals reviewed.         Assessment & Plan:

## 2016-01-10 NOTE — Patient Instructions (Signed)
Follow up in 3-4 months to recheck sugars and cholesterol We'll notify you of your lab results and make any changes if needed Continue to work on healthy diet and regular exercise We'll call you with your eye exam Call with any questions or concerns Keep up the good work! Have a great summer!!!

## 2016-01-10 NOTE — Progress Notes (Signed)
Pre visit review using our clinic review tool, if applicable. No additional management support is needed unless otherwise documented below in the visit note. 

## 2016-01-13 ENCOUNTER — Encounter: Payer: Self-pay | Admitting: Family Medicine

## 2016-01-31 ENCOUNTER — Telehealth: Payer: Self-pay | Admitting: Family Medicine

## 2016-01-31 MED ORDER — OMEPRAZOLE 40 MG PO CPDR: DELAYED_RELEASE_CAPSULE | ORAL | Status: DC

## 2016-01-31 NOTE — Telephone Encounter (Signed)
Pt needs refill on omeprazole, [facility] [address], pt states that he is going out of town tomorrow. pt states that he asked for this refill days ago through pharmacy, this may have been going to [facility] location.

## 2016-01-31 NOTE — Telephone Encounter (Signed)
Medication filled to pharmacy as requested.   

## 2016-02-01 ENCOUNTER — Other Ambulatory Visit: Payer: Self-pay | Admitting: General Practice

## 2016-02-01 MED ORDER — METFORMIN HCL 1000 MG PO TABS
ORAL_TABLET | ORAL | Status: DC
Start: 2016-02-01 — End: 2016-06-22

## 2016-03-04 ENCOUNTER — Other Ambulatory Visit: Payer: Self-pay | Admitting: Family Medicine

## 2016-03-04 ENCOUNTER — Other Ambulatory Visit: Payer: Self-pay | Admitting: Cardiovascular Disease

## 2016-03-04 DIAGNOSIS — E038 Other specified hypothyroidism: Secondary | ICD-10-CM

## 2016-03-20 ENCOUNTER — Encounter: Payer: Self-pay | Admitting: General Practice

## 2016-03-20 DIAGNOSIS — H43813 Vitreous degeneration, bilateral: Secondary | ICD-10-CM | POA: Diagnosis not present

## 2016-03-20 DIAGNOSIS — H524 Presbyopia: Secondary | ICD-10-CM | POA: Diagnosis not present

## 2016-03-20 DIAGNOSIS — H2513 Age-related nuclear cataract, bilateral: Secondary | ICD-10-CM | POA: Diagnosis not present

## 2016-03-20 DIAGNOSIS — E119 Type 2 diabetes mellitus without complications: Secondary | ICD-10-CM | POA: Diagnosis not present

## 2016-03-20 LAB — HM DIABETES EYE EXAM

## 2016-04-30 ENCOUNTER — Other Ambulatory Visit: Payer: Self-pay | Admitting: Family Medicine

## 2016-05-01 ENCOUNTER — Other Ambulatory Visit: Payer: Self-pay | Admitting: Cardiovascular Disease

## 2016-05-09 ENCOUNTER — Ambulatory Visit (INDEPENDENT_AMBULATORY_CARE_PROVIDER_SITE_OTHER): Payer: PPO | Admitting: Family Medicine

## 2016-05-09 ENCOUNTER — Encounter: Payer: Self-pay | Admitting: Family Medicine

## 2016-05-09 VITALS — BP 130/72 | HR 52 | Temp 98.1°F | Resp 16 | Ht 67.0 in | Wt 227.5 lb

## 2016-05-09 DIAGNOSIS — E039 Hypothyroidism, unspecified: Secondary | ICD-10-CM

## 2016-05-09 DIAGNOSIS — E1140 Type 2 diabetes mellitus with diabetic neuropathy, unspecified: Secondary | ICD-10-CM

## 2016-05-09 DIAGNOSIS — E785 Hyperlipidemia, unspecified: Secondary | ICD-10-CM

## 2016-05-09 DIAGNOSIS — I10 Essential (primary) hypertension: Secondary | ICD-10-CM | POA: Diagnosis not present

## 2016-05-09 DIAGNOSIS — Z23 Encounter for immunization: Secondary | ICD-10-CM | POA: Diagnosis not present

## 2016-05-09 LAB — CBC WITH DIFFERENTIAL/PLATELET
BASOS PCT: 0.7 % (ref 0.0–3.0)
Basophils Absolute: 0 10*3/uL (ref 0.0–0.1)
EOS PCT: 3.8 % (ref 0.0–5.0)
Eosinophils Absolute: 0.3 10*3/uL (ref 0.0–0.7)
HCT: 42.2 % (ref 39.0–52.0)
HEMOGLOBIN: 14.8 g/dL (ref 13.0–17.0)
LYMPHS ABS: 1.9 10*3/uL (ref 0.7–4.0)
Lymphocytes Relative: 26.4 % (ref 12.0–46.0)
MCHC: 35 g/dL (ref 30.0–36.0)
MCV: 87.5 fl (ref 78.0–100.0)
MONO ABS: 0.6 10*3/uL (ref 0.1–1.0)
MONOS PCT: 7.8 % (ref 3.0–12.0)
Neutro Abs: 4.5 10*3/uL (ref 1.4–7.7)
Neutrophils Relative %: 61.3 % (ref 43.0–77.0)
Platelets: 147 10*3/uL — ABNORMAL LOW (ref 150.0–400.0)
RBC: 4.82 Mil/uL (ref 4.22–5.81)
RDW: 13.7 % (ref 11.5–15.5)
WBC: 7.3 10*3/uL (ref 4.0–10.5)

## 2016-05-09 LAB — BASIC METABOLIC PANEL
BUN: 20 mg/dL (ref 6–23)
CHLORIDE: 101 meq/L (ref 96–112)
CO2: 28 mEq/L (ref 19–32)
Calcium: 9.4 mg/dL (ref 8.4–10.5)
Creatinine, Ser: 0.91 mg/dL (ref 0.40–1.50)
GFR: 86.84 mL/min (ref >60.00)
Glucose, Bld: 230 mg/dL — ABNORMAL HIGH (ref 70–99)
POTASSIUM: 4.8 meq/L (ref 3.5–5.1)
Sodium: 136 mEq/L (ref 135–145)

## 2016-05-09 LAB — TSH: TSH: 3.4 u[IU]/mL (ref 0.35–4.50)

## 2016-05-09 LAB — LDL CHOLESTEROL, DIRECT: Direct LDL: 61 mg/dL

## 2016-05-09 LAB — LIPID PANEL
CHOL/HDL RATIO: 4
Cholesterol: 128 mg/dL (ref 0–200)
HDL: 29.3 mg/dL — ABNORMAL LOW (ref >39.00)
NONHDL: 98.3
Triglycerides: 322 mg/dL — ABNORMAL HIGH (ref 0.0–149.0)
VLDL: 64.4 mg/dL — AB (ref 0.0–40.0)

## 2016-05-09 LAB — HEPATIC FUNCTION PANEL
ALT: 18 U/L (ref 0–53)
AST: 14 U/L (ref 0–37)
Albumin: 4.1 g/dL (ref 3.5–5.2)
Alkaline Phosphatase: 55 U/L (ref 39–117)
BILIRUBIN DIRECT: 0.1 mg/dL (ref 0.0–0.3)
BILIRUBIN TOTAL: 0.5 mg/dL (ref 0.2–1.2)
Total Protein: 6.8 g/dL (ref 6.0–8.3)

## 2016-05-09 LAB — HEMOGLOBIN A1C: Hgb A1c MFr Bld: 8.7 % — ABNORMAL HIGH (ref 4.6–6.5)

## 2016-05-09 NOTE — Progress Notes (Signed)
   Subjective:    Patient ID: Steve Wiggins, male    DOB: August 08, 1940, 75 y.o.   MRN: [id number]  HPI DM- chronic problem.  We decreased his Metformin to 1/2 tab twice daily at last visit due to diarrhea but he reports home CBGs have been quite high since doing this.  CBG 371 this AM.  Also on Glimepiride 4mg  daily.  On ACE for renal protection.  UTD on eye exam, foot exam.  No numbness/tingling of hands/feet above baseline.  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  Hyperlipidemia- chronic problem, on Crestor daily.  Denies abd pain, N/V, myalgias.      HTN- chronic problem, on Amlodipine, Coreg, Hydralazine, HCTZ, Lisinopril w/ adequate control.  Denies CP, SOB, HAs, visual changes,   Review of Systems For ROS see HPI     Objective:   Physical Exam  Constitutional: He is oriented to person, place, and time. He appears well-developed and well-nourished. No distress.  obese  HENT:  Head: Normocephalic and atraumatic.  Eyes: Conjunctivae and EOM are normal. Pupils are equal, round, and reactive to light.  Neck: Normal range of motion. Neck supple. No thyromegaly present.  Cardiovascular: Normal rate, regular rhythm, normal heart sounds and intact distal pulses.   No murmur heard. Pulmonary/Chest: Effort normal and breath sounds normal. No respiratory distress.  Abdominal: Soft. Bowel sounds are normal. He exhibits no distension.  Musculoskeletal: He exhibits no edema.  Lymphadenopathy:    He has no cervical adenopathy.  Neurological: He is alert and oriented to person, place, and time. No cranial nerve deficit.  Skin: Skin is warm and dry.  Psychiatric: He has a normal mood and affect. His behavior is normal.  Vitals reviewed.         Assessment & Plan:

## 2016-05-09 NOTE — Progress Notes (Signed)
Pre visit review using our clinic review tool, if applicable. No additional management support is needed unless otherwise documented below in the visit note. 

## 2016-05-09 NOTE — Patient Instructions (Signed)
Schedule your complete physical for after 2/20 We'll notify you of your lab results and make any changes if needed If the A1C is high (as I suspect), we'll start the Januvia once daily and you can use your coupon to get 1 free month Continue to work on healthy diet and regular exercise- you can do it! You are up to date on eye exam- this is great news!! Call with any questions or concerns Happy Fall!!!

## 2016-05-09 NOTE — Assessment & Plan Note (Signed)
Chronic problem.  Well controlled today.  Asymptomatic.  Has recently resumed walking- applauded his efforts.  Check labs.  No anticipated med changes.  Will follow.

## 2016-05-09 NOTE — Assessment & Plan Note (Signed)
New at last visit.  Tolerating Synthroid.  Check labs.  Adjust meds prn

## 2016-05-09 NOTE — Assessment & Plan Note (Signed)
Chronic problem.  Tolerating statin w/o difficulty.  Check labs.  Adjust meds prn  

## 2016-05-09 NOTE — Assessment & Plan Note (Signed)
Deteriorated.  Pt's reported home CBGs are really high since decreasing the Metformin to 500mg  BID.  His GI sxs have nearly completely resolved but his sugar control is much worse.  My guess is that his A1C will reflect this.  If a new medication is needed, we will likely proceed w/ Januvia.  Pt was given a coupon for a free 30 day trial.  UTD on eye exam, foot exam.  On ACE for renal protection.  Check labs.  Will follow closely.

## 2016-05-10 ENCOUNTER — Telehealth: Payer: Self-pay | Admitting: Family Medicine

## 2016-05-10 NOTE — Telephone Encounter (Signed)
Patient states he was instructed by Shanda BumpsJessica to contacted his insurance regarding coverage for Januvia.  He states his insurance does cover this medication, he is responsible for a $8.25 copay.

## 2016-05-10 NOTE — Telephone Encounter (Signed)
Called pt and LMOVM to inform of lab results. Closing this encounter as the lab encounter is still open.

## 2016-05-11 ENCOUNTER — Other Ambulatory Visit: Payer: Self-pay | Admitting: General Practice

## 2016-05-11 MED ORDER — FENOFIBRATE 160 MG PO TABS: 160.0000 mg | ORAL_TABLET | Freq: Every day | ORAL | 6 refills | Status: DC

## 2016-05-11 MED ORDER — SITAGLIPTIN PHOSPHATE 100 MG PO TABS: 100.0000 mg | ORAL_TABLET | Freq: Every day | ORAL | 6 refills | Status: DC

## 2016-05-14 ENCOUNTER — Other Ambulatory Visit: Payer: Self-pay | Admitting: Family Medicine

## 2016-05-30 ENCOUNTER — Other Ambulatory Visit: Payer: Self-pay | Admitting: Cardiovascular Disease

## 2016-05-31 ENCOUNTER — Telehealth: Payer: Self-pay | Admitting: Cardiovascular Disease

## 2016-05-31 NOTE — Progress Notes (Signed)
Cardiology Office Note    Date:  06/04/2016   ID:  Steve Wiggins, DOB 03-05-1941, MRN [id number]  PCP:  Neena Rhymes, MD  Cardiologist:  Dr. Excell Seltzer  Chief Complaint: f/u for CAD  History of Present Illness:   Steve Wiggins is a 75 y.o. male with hx of CAD s/p DES to OM 07/2010, HTN, mild carotid artery disease, HLD and DM who presented for follow up.   He had a stress test in 07/2010 for dyspnea on exertion (states he never had chest pain) which was abnormal. It was followed by a diagnostic cath (08/2010) which resulted in PCI to OM. He also had moderately tight stenosis of a diagonal branch, for which medical management was recommended.  This was done in [facility].  The patient was doing well on cardiac stand point when last seen by Dr. Excell Seltzer 04/2015.   Here today for follow up. He is recently more tired/fatigue with activity. He is unable to push move grass that he used. Intermittent LE edema. Denies orthopnea, PND, syncope, abdominal tightness, melena, dizziness. He has not been eating well. Adds extra salt to his diet and eats lots of eggs.    Past Medical History:  Diagnosis Date  . Coronary artery disease    a. 07/2010 echo: nl EF;  b. 08/2010 Cath/PCI: Resolute DES to OM  . Diabetes mellitus    type 2  . GERD (gastroesophageal reflux disease)   . History of gastric ulcer   . History of gout   . History of head injury FELL OFF LADDER,  1999--  S/P DRAINAGE SUBDERMAL HEMATOMA--  NO RESIDUAL  . Hyperlipidemia   . Hypertension   . Mild carotid artery disease (HCC)    a. 07/2010 Carotid U/S: bilat ICA 0-35%   . Obesity   . Penile lesion   . Umbilical hernia     Past Surgical History:  Procedure Laterality Date  . CARDIOVASCULAR STRESS TEST  08-28-2010  ([place], [place])   EF 66%/ SMALL AREA PROXIMAL POSTEROLATERAL ISCHEMIA  . CHOLECYSTECTOMY  06/05/2012   Procedure: LAPAROSCOPIC CHOLECYSTECTOMY;  Surgeon: Emelia Loron, MD;  Location: [facility] OR;   Service: General;  Laterality: N/A;  . CORONARY ANGIOPLASTY WITH STENT PLACEMENT  09-26-2011  ([place], [place])   DRUG-ELUTING STENT X1 TO FIRST OBTUSE MARGINAL BRANCH CIRCUMFLEX/ MODERATE LESION DIAGINAL BRANCH TREATED MEDICALLY  . SURG FOR DRAINAGE SUBDURAL  HEMATOMA  1999  . TRANSTHORACIC ECHOCARDIOGRAM  08-21-2010   LVF NORMAL/ MILD MITRAL AND TRICUSID REGURG.  . UMBILICAL HERNIA REPAIR  06/05/2012   Procedure: HERNIA REPAIR UMBILICAL ADULT;  Surgeon: Emelia Loron, MD;  Location: [facility] OR;  Service: General;  Laterality: N/A;  Primary Umbilical Hernia Repair    Current Medications:  Prior to Admission medications   Medication Sig Start Date End Date Taking? Authorizing Provider  amLODipine (NORVASC) 5 MG tablet Take 1 tablet (5 mg total) by mouth every morning. Please call and schedule a one year follow up appointment 03/05/16  Yes Tonny Bollman, MD  aspirin 81 MG tablet Take 81 mg by mouth every morning.    Yes Historical Provider, MD  carvedilol (COREG) 25 MG tablet Take 1 tablet (25 mg total) by mouth 2 (two) times daily with a meal. 05/25/15  Yes Tonny Bollman, MD  fenofibrate 160 MG tablet Take 1 tablet (160 mg total) by mouth daily. 05/11/16  Yes Sheliah Hatch, MD  glimepiride (AMARYL) 4 MG tablet TAKE 1 TABLET (4 MG TOTAL) BY MOUTH DAILY BEFORE  BREAKFAST. 12/12/15  Yes Sheliah HatchKatherine E Tabori, MD  hydrALAZINE (APRESOLINE) 25 MG tablet TAKE 1 TABLET THREE TIMES A DAY 10/19/15  Yes Tonny BollmanMichael Cooper, MD  hydrochlorothiazide (HYDRODIURIL) 25 MG tablet Take 12.5 mg by mouth daily.   Yes Historical Provider, MD  IBUPROFEN PO Take by mouth. PRN FOR PAIN   Yes Historical Provider, MD  Lancets (ONETOUCH ULTRASOFT) lancets Pt tests sugars twice daily.Dx. E11.40 04/27/15  Yes Sheliah HatchKatherine E Tabori, MD  levothyroxine (SYNTHROID, LEVOTHROID) 50 MCG tablet TAKE 1 TABLET (50 MCG TOTAL) BY MOUTH DAILY. 03/05/16  Yes Sheliah HatchKatherine E Tabori, MD  lisinopril (PRINIVIL,ZESTRIL) 20 MG tablet Take 2 tablets (40 mg  total) by mouth every morning. *Patient is overdue for an appointment. Please call and schedule for further refills* 05/30/16  Yes Tonny BollmanMichael Cooper, MD  metFORMIN (GLUCOPHAGE) 1000 MG tablet TAKE 1 TABLET (1,000 MG TOTAL) BY MOUTH 2 (TWO) TIMES DAILY. 02/01/16  Yes Sheliah HatchKatherine E Tabori, MD  omeprazole (PRILOSEC) 40 MG capsule TAKE 1 CAPSULE (40 MG TOTAL) BY MOUTH EVERY EVENING. 01/31/16  Yes Sheliah HatchKatherine E Tabori, MD  ONE TOUCH ULTRA TEST test strip USE TO CHECK SUGARS TWICE DAILY. DX E11.40 04/30/16  Yes Sheliah HatchKatherine E Tabori, MD  rosuvastatin (CRESTOR) 10 MG tablet TAKE 1 TABLET (10 MG TOTAL) BY MOUTH DAILY. 05/14/16  Yes Sheliah HatchKatherine E Tabori, MD  sitaGLIPtin (JANUVIA) 100 MG tablet Take 1 tablet (100 mg total) by mouth daily. 05/11/16  Yes Sheliah HatchKatherine E Tabori, MD  tamsulosin (FLOMAX) 0.4 MG CAPS capsule Take 0.8 mg by mouth daily after supper.  03/11/14  Yes Historical Provider, MD  traZODone (DESYREL) 50 MG tablet Take 0.5-1 tablets (25-50 mg total) by mouth at bedtime as needed for sleep. 12/14/15  Yes Sheliah HatchKatherine E Tabori, MD  triamcinolone ointment (KENALOG) 0.1 % Apply 1 application topically 2 (two) times daily. 09/12/15  Yes Sheliah HatchKatherine E Tabori, MD     Allergies:   Patient has no known allergies.   Social History   Social History  . Marital status: Married    Spouse name: N/A  . Number of children: N/A  . Years of education: N/A   Social History Main Topics  . Smoking status: Former Games developermoker  . Smokeless tobacco: Never Used     Comment: quit over 50 yrs ago.  . Alcohol use No     Comment: previously had an occasional drink but nothing in 50 yrs.  . Drug use: No  . Sexual activity: Not Asked   Other Topics Concern  . None   Social History Narrative   [place]ives in [place] with his wife.     Family History:  The patient's family history includes Heart attack in his maternal grandmother and mother; Stroke in his mother.  ROS:   Please see the history of present illness.    ROS All other systems  reviewed and are negative.   PHYSICAL EXAM:   VS:  BP (!) 150/60   Pulse (!) 46   Ht 5\' 7"  (1.702 m)   Wt 228 lb (103.4 kg)   BMI 35.71 kg/m    GEN: Well nourished, well developed, in no acute distress  HEENT: normal  Neck: no JVD, carotid bruits, or masses Cardiac: RR with bradycardia ; no murmurs, rubs, or gallops, Trace BL LE edema  Respiratory:  clear to auscultation bilaterally, normal work of breathing GI: soft, nontender, nondistended, + BS MS: no deformity or atrophy  Skin: warm and dry, no rash Neuro:  Alert and Oriented x 3, Strength  and sensation are intact Psych: euthymic mood, full affect  Wt Readings from Last 3 Encounters:  06/04/16 228 lb (103.4 kg)  05/09/16 227 lb 8 oz (103.2 kg)  01/10/16 223 lb 8 oz (101.4 kg)      Studies/Labs Reviewed:   EKG:  EKG is ordered today.  The ekg ordered today demonstrates sinus bradycardia at rate of 46 bpm.   Recent Labs: 05/09/2016: ALT 18; BUN 20; Creatinine, Ser 0.91; Hemoglobin 14.8; Platelets 147.0; Potassium 4.8; Sodium 136; TSH 3.40   Lipid Panel    Component Value Date/Time   CHOL 128 05/09/2016 1100   TRIG 322.0 (H) 05/09/2016 1100   HDL 29.30 (L) 05/09/2016 1100   CHOLHDL 4 05/09/2016 1100   VLDL 64.4 (H) 05/09/2016 1100   LDLCALC 37 07/01/2014 0904   LDLDIRECT 61.0 05/09/2016 1100    Additional studies/ records that were reviewed today include:   As above   ASSESSMENT & PLAN:    1. Fatigue - Recently got worse. Mostly occurs with exertion with out dyspnea. He noted to be bradycardic today. This might be the reason he is feeling fatigue. No syncope. He can not distinguish if his symptoms is similar to prior angina. Given recent uncontrolled HTN, HLD and DM with going symptoms will get Lexiscan. He is afraid of walking on treadmill as last time (2012) he almost passed out during exercise stress test.  - Continue ASA and statin  2. Sinus Bradycardia - No syncope or dizziness. Will reduce coreg to  half dose to 12.5mg  BID.   3. HTN - Uncontrolled here to 150/60. States at home runs in 130/80s. He will bring BP cuff during next OV. Reduce coreg as above. Will increase HCTZ to 25mg  qd. Keep log.   4. HLD with hypertriglyceridemia - 05/09/2016: Cholesterol 128; HDL 29.30; Triglycerides 322.0; VLDL 64.4, direct LDL 61 - followed by PCP. Continue statin. Advised lifestyle changes including daily exercise and heart healthy diet.   5. LE Edema - Likely due to excess salt intake. Advised to cut back. Does not seems in HF. Elevate legs.   6. DM - A!c 7.3-->8.7. Per PCP.     Medication Adjustments/Labs and Tests Ordered: Current medicines are reviewed at length with the patient today.  Concerns regarding medicines are outlined above.  Medication changes, Labs and Tests ordered today are listed in the Patient Instructions below. Patient Instructions  Your physician has recommended you make the following change in your medication:  DECREASE CARVEDILOL TO 12.5 MG  TWICE  DAILY  INCREASE  HYDROCHLOROTHIAZIDES  TO   25  MG  EVERY DAY   Your physician has requested that you have a lexiscan myoview. For further information please visit [email or web address]. Please follow instruction sheet, as given.   Your physician recommends that you schedule a follow-up appointment in:  2 WEEKS  WITH   VIN 06-21-16    Lorelei PontSigned, Raina Sole, [place]  06/04/2016 10:42 AM    [facility] [address], [place], [place]  [address] Phone: [phone]; Fax: [phone]

## 2016-06-01 NOTE — Telephone Encounter (Signed)
error 

## 2016-06-04 ENCOUNTER — Ambulatory Visit (INDEPENDENT_AMBULATORY_CARE_PROVIDER_SITE_OTHER): Payer: PPO | Admitting: Physician Assistant

## 2016-06-04 ENCOUNTER — Encounter: Payer: Self-pay | Admitting: Physician Assistant

## 2016-06-04 VITALS — BP 150/60 | HR 46 | Ht 67.0 in | Wt 228.0 lb

## 2016-06-04 DIAGNOSIS — E782 Mixed hyperlipidemia: Secondary | ICD-10-CM

## 2016-06-04 DIAGNOSIS — I10 Essential (primary) hypertension: Secondary | ICD-10-CM | POA: Diagnosis not present

## 2016-06-04 DIAGNOSIS — R001 Bradycardia, unspecified: Secondary | ICD-10-CM

## 2016-06-04 DIAGNOSIS — I2510 Atherosclerotic heart disease of native coronary artery without angina pectoris: Secondary | ICD-10-CM

## 2016-06-04 DIAGNOSIS — R5383 Other fatigue: Secondary | ICD-10-CM

## 2016-06-04 DIAGNOSIS — R600 Localized edema: Secondary | ICD-10-CM

## 2016-06-04 MED ORDER — AMLODIPINE BESYLATE 5 MG PO TABS
5.0000 mg | ORAL_TABLET | Freq: Every morning | ORAL | 3 refills | Status: DC
Start: 2016-06-04 — End: 2016-06-22

## 2016-06-04 MED ORDER — CARVEDILOL 12.5 MG PO TABS
12.5000 mg | ORAL_TABLET | Freq: Two times a day (BID) | ORAL | 3 refills | Status: DC
Start: 2016-06-04 — End: 2016-06-22

## 2016-06-04 MED ORDER — ROSUVASTATIN CALCIUM 10 MG PO TABS: 10.0000 mg | ORAL_TABLET | Freq: Every day | ORAL | 3 refills | Status: DC

## 2016-06-04 MED ORDER — HYDROCHLOROTHIAZIDE 25 MG PO TABS: 25.0000 mg | ORAL_TABLET | Freq: Every day | ORAL | 3 refills | Status: DC

## 2016-06-04 MED ORDER — LISINOPRIL 40 MG PO TABS: 40.0000 mg | ORAL_TABLET | Freq: Every morning | ORAL | 3 refills | Status: DC

## 2016-06-04 MED ORDER — HYDRALAZINE HCL 25 MG PO TABS: 25.0000 mg | ORAL_TABLET | Freq: Three times a day (TID) | ORAL | 2 refills | Status: DC

## 2016-06-04 NOTE — Patient Instructions (Signed)
Your physician has recommended you make the following change in your medication:  DECREASE CARVEDILOL TO 12.5 MG  TWICE  DAILY  INCREASE  HYDROCHLOROTHIAZIDES  TO   25  MG  EVERY DAY   Your physician has requested that you have a lexiscan myoview. For further information please visit [email or web address]. Please follow instruction sheet, as given.   Your physician recommends that you schedule a follow-up appointment in:  2 WEEKS  WITH   VIN 06-21-16

## 2016-06-05 ENCOUNTER — Ambulatory Visit (HOSPITAL_COMMUNITY): Payer: PPO | Attending: Cardiovascular Disease

## 2016-06-05 DIAGNOSIS — I10 Essential (primary) hypertension: Secondary | ICD-10-CM | POA: Diagnosis not present

## 2016-06-05 DIAGNOSIS — R5383 Other fatigue: Secondary | ICD-10-CM | POA: Insufficient documentation

## 2016-06-05 DIAGNOSIS — E119 Type 2 diabetes mellitus without complications: Secondary | ICD-10-CM | POA: Diagnosis not present

## 2016-06-05 DIAGNOSIS — R9439 Abnormal result of other cardiovascular function study: Secondary | ICD-10-CM | POA: Insufficient documentation

## 2016-06-05 DIAGNOSIS — I2510 Atherosclerotic heart disease of native coronary artery without angina pectoris: Secondary | ICD-10-CM | POA: Diagnosis not present

## 2016-06-05 DIAGNOSIS — I779 Disorder of arteries and arterioles, unspecified: Secondary | ICD-10-CM | POA: Diagnosis not present

## 2016-06-05 LAB — MYOCARDIAL PERFUSION IMAGING
CHL CUP NUCLEAR SDS: 4
LHR: 0.35
LVDIAVOL: 119 mL (ref 62–150)
LVSYSVOL: 56 mL
NUC STRESS TID: 1
Peak HR: 73 {beats}/min
Rest HR: 55 {beats}/min
SRS: 0
SSS: 4

## 2016-06-05 MED ORDER — TECHNETIUM TC 99M TETROFOSMIN IV KIT
10.8000 | PACK | Freq: Once | INTRAVENOUS | Status: AC | PRN
  Administered 2016-06-05: 10.8 via INTRAVENOUS
  Filled 2016-06-05: qty 11

## 2016-06-05 MED ORDER — REGADENOSON 0.4 MG/5ML IV SOLN
0.4000 mg | Freq: Once | INTRAVENOUS | Status: AC
  Administered 2016-06-05: 0.4 mg via INTRAVENOUS

## 2016-06-05 MED ORDER — TECHNETIUM TC 99M TETROFOSMIN IV KIT
31.5000 | PACK | Freq: Once | INTRAVENOUS | Status: AC | PRN
  Administered 2016-06-05: 31.5 via INTRAVENOUS
  Filled 2016-06-05: qty 32

## 2016-06-09 ENCOUNTER — Other Ambulatory Visit: Payer: Self-pay | Admitting: Family Medicine

## 2016-06-09 ENCOUNTER — Other Ambulatory Visit: Payer: Self-pay | Admitting: Cardiovascular Disease

## 2016-06-10 ENCOUNTER — Other Ambulatory Visit: Payer: Self-pay | Admitting: Cardiovascular Disease

## 2016-06-11 ENCOUNTER — Other Ambulatory Visit: Payer: Self-pay | Admitting: Cardiovascular Disease

## 2016-06-11 NOTE — Telephone Encounter (Signed)
Medication Detail    Disp Refills Start End   amLODipine (NORVASC) 5 MG tablet 90 tablet 3 06/04/2016    Sig - Route: Take 1 tablet (5 mg total) by mouth every morning. - Oral   E-Prescribing Status: Receipt confirmed by pharmacy (06/04/2016 10:34 AM EST)   Pharmacy   [id number] - [place], [place] - [address]

## 2016-06-11 NOTE — Telephone Encounter (Signed)
Medication Detail    Disp Refills Start End   hydrochlorothiazide (HYDRODIURIL) 25 MG tablet 90 tablet 3 06/04/2016    Sig - Route: Take 1 tablet (25 mg total) by mouth daily. - Oral   E-Prescribing Status: Receipt confirmed by pharmacy (06/04/2016 10:34 AM EST)   Pharmacy   [id number] - [place], [place] - [address]

## 2016-06-11 NOTE — Telephone Encounter (Signed)
Medication Detail    Disp Refills Start End   lisinopril (PRINIVIL,ZESTRIL) 40 MG tablet 90 tablet 3 06/04/2016    Sig - Route: Take 1 tablet (40 mg total) by mouth every morning. - Oral   E-Prescribing Status: Receipt confirmed by pharmacy (06/04/2016 10:34 AM EST)   Pharmacy   [id number] - [place], [place] - [address]

## 2016-06-12 ENCOUNTER — Other Ambulatory Visit: Payer: Self-pay | Admitting: Family Medicine

## 2016-06-20 NOTE — Progress Notes (Signed)
Cardiology Office Note    Date:  06/22/2016   ID:  Steve CanningClarence M Wiggins, DOB 11/03/1940, MRN [id number]  PCP:  Neena RhymesKatherine Tabori, MD  Cardiologist: Dr. Excell Seltzerooper  Chief Complaint: 2 weeks follow up  History of Present Illness:   Steve Wiggins is a 75 y.o. male with hx of CAD s/p DES to OM 07/2010, HTN, mild carotid artery disease, HLD and DM who presented for follow up.   He had a stress test in 07/2010 for dyspnea on exertion (states he never had chest pain) which was abnormal. It was followed by a diagnostic cath (08/2010) which resulted in PCI to OM. He also had moderately tight stenosis of a diagonal branch, for which medical management was recommended. [facility] [place].        Seen by me 06/04/16 for exertional fatigue. He was noted to be bradycardic and coreg was reduced. BP was elevated and HCTZ increased to 25mg  qd. Advised to cut back on salt intake. F/u stress test was low risk with small defect of moderate severity present in the apex location. LV function mildly reduced to low normal (EF 53); mild global hypokinesis. Dr. Excell Seltzerooper advised  medical management of CAD.  Here today for follow up. His BP runs in 150-160s/60 at home and heart rate of 50s all the time. This fatigue is worsen since last office visit. Complains of dyspnea with extreme exertion. No chest pain. He is trying to cat back on salt. No routine exercise.    Past Medical History:  Diagnosis Date  . Coronary artery disease    a. 07/2010 echo: nl EF;  b. 08/2010 Cath/PCI: Resolute DES to OM  . Diabetes mellitus    type 2  . GERD (gastroesophageal reflux disease)   . History of gastric ulcer   . History of gout   . History of head injury FELL OFF LADDER,  1999--  S/P DRAINAGE SUBDERMAL HEMATOMA--  NO RESIDUAL  . Hyperlipidemia   . Hypertension   . Mild carotid artery disease (HCC)    a. 07/2010 Carotid U/S: bilat ICA 0-35%   . Obesity   . Penile lesion   . Umbilical hernia     Past Surgical  History:  Procedure Laterality Date  . CARDIOVASCULAR STRESS TEST  08-28-2010  ([place]   EF 66%/ SMALL AREA PROXIMAL POSTEROLATERAL ISCHEMIA  . CHOLECYSTECTOMY  06/05/2012   Procedure: LAPAROSCOPIC CHOLECYSTECTOMY;  Surgeon: Emelia LoronMatthew Wakefield, MD;  [facility];  Service: General;  Laterality: N/A;  . CORONARY ANGIOPLASTY WITH STENT PLACEMENT  09-26-2011  ([place], [place])   DRUG-ELUTING STENT X1 TO FIRST OBTUSE MARGINAL BRANCH CIRCUMFLEX/ MODERATE LESION DIAGINAL BRANCH TREATED MEDICALLY  . SURG FOR DRAINAGE SUBDURAL  HEMATOMA  1999  . TRANSTHORACIC ECHOCARDIOGRAM  08-21-2010   LVF NORMAL/ MILD MITRAL AND TRICUSID REGURG.  . UMBILICAL HERNIA REPAIR  06/05/2012   Procedure: HERNIA REPAIR UMBILICAL ADULT;  Surgeon: Emelia LoronMatthew Wakefield, MD;  [facility];  Service: General;  Laterality: N/A;  Primary Umbilical Hernia Repair    Current Medications: Prior to Admission medications   Medication Sig Start Date End Date Taking? Authorizing Provider  amLODipine (NORVASC) 5 MG tablet Take 1 tablet (5 mg total) by mouth every morning. 06/04/16   Burgundy Matuszak, PA  aspirin 81 MG tablet Take 81 mg by mouth every morning.     Historical Provider, MD  carvedilol (COREG) 12.5 MG tablet Take 1 tablet (12.5 mg total) by mouth 2 (two) times daily with  a meal. 06/04/16   Josue Falconi, PA  fenofibrate 160 MG tablet Take 1 tablet (160 mg total) by mouth daily. 05/11/16   Sheliah Hatch, MD  glimepiride (AMARYL) 4 MG tablet TAKE 1 TABLET (4 MG TOTAL) BY MOUTH DAILY BEFORE BREAKFAST. 06/11/16   Sheliah Hatch, MD  hydrALAZINE (APRESOLINE) 25 MG tablet Take 1 tablet (25 mg total) by mouth 3 (three) times daily. 06/04/16   Usher Hedberg, PA  hydrochlorothiazide (HYDRODIURIL) 25 MG tablet Take 1 tablet (25 mg total) by mouth daily. 06/04/16   Adessa Primiano, PA  IBUPROFEN PO Take by mouth. PRN FOR PAIN    Historical Provider, MD  Lancets (ONETOUCH ULTRASOFT) lancets PT TESTS SUGARS  TWICE DAILY.DX. E11.40 06/12/16   Sheliah Hatch, MD  levothyroxine (SYNTHROID, LEVOTHROID) 50 MCG tablet TAKE 1 TABLET (50 MCG TOTAL) BY MOUTH DAILY. 03/05/16   Sheliah Hatch, MD  lisinopril (PRINIVIL,ZESTRIL) 40 MG tablet Take 1 tablet (40 mg total) by mouth every morning. 06/04/16   Earmon Sherrow, PA  metFORMIN (GLUCOPHAGE) 1000 MG tablet TAKE 1 TABLET (1,000 MG TOTAL) BY MOUTH 2 (TWO) TIMES DAILY. 02/01/16   Sheliah Hatch, MD  omeprazole (PRILOSEC) 40 MG capsule TAKE 1 CAPSULE (40 MG TOTAL) BY MOUTH EVERY EVENING. 01/31/16   Sheliah Hatch, MD  ONE TOUCH ULTRA TEST test strip USE TO CHECK SUGARS TWICE DAILY. DX E11.40 04/30/16   Sheliah Hatch, MD  rosuvastatin (CRESTOR) 10 MG tablet Take 1 tablet (10 mg total) by mouth daily. 06/04/16   Jode Lippe, PA  sitaGLIPtin (JANUVIA) 100 MG tablet Take 1 tablet (100 mg total) by mouth daily. 05/11/16   Sheliah Hatch, MD  tamsulosin (FLOMAX) 0.4 MG CAPS capsule Take 0.8 mg by mouth daily after supper.  03/11/14   Historical Provider, MD  traZODone (DESYREL) 50 MG tablet Take 0.5-1 tablets (25-50 mg total) by mouth at bedtime as needed for sleep. 12/14/15   Sheliah Hatch, MD  triamcinolone ointment (KENALOG) 0.1 % Apply 1 application topically 2 (two) times daily. 09/12/15   Sheliah Hatch, MD    Allergies:   Patient has no known allergies.   Social History   Social History  . Marital status: Married    Spouse name: N/A  . Number of children: N/A  . Years of education: N/A   Social History Main Topics  . Smoking status: Former Games developer  . Smokeless tobacco: Never Used     Comment: quit over 50 yrs ago.  . Alcohol use No     Comment: previously had an occasional drink but nothing in 50 yrs.  . Drug use: No  . Sexual activity: Not Asked   Other Topics Concern  . None   Social History Narrative   Lives in [place] with his wife.     Family History:  The patient's family history includes Heart attack in  his maternal grandmother and mother; Stroke in his mother.   ROS:   Please see the history of present illness.    ROS All other systems reviewed and are negative.   PHYSICAL EXAM:   VS:  BP (!) 150/60   Pulse (!) 50   Ht 5\' 7"  (1.702 m)   Wt 228 lb 8 oz (103.6 kg)   BMI 35.79 kg/m    GEN: Well nourished, well developed, in no acute distress  HEENT: normal  Neck: no JVD, carotid bruits, or masses Cardiac:RRR; no murmurs, rubs, or gallops,no edema  Respiratory:  clear to auscultation bilaterally, normal work of breathing GI: soft, nontender, nondistended, + BS MS: no deformity or atrophy  Skin: warm and dry, no rash Neuro:  Alert and Oriented x 3, Strength and sensation are intact Psych: euthymic mood, full affect  Wt Readings from Last 3 Encounters:  06/22/16 228 lb 8 oz (103.6 kg)  06/05/16 228 lb (103.4 kg)  06/04/16 228 lb (103.4 kg)      Studies/Labs Reviewed:   EKG:  EKG is not ordered today.    Recent Labs: 05/09/2016: ALT 18; BUN 20; Creatinine, Ser 0.91; Hemoglobin 14.8; Platelets 147.0; Potassium 4.8; Sodium 136; TSH 3.40   Lipid Panel    Component Value Date/Time   CHOL 128 05/09/2016 1100   TRIG 322.0 (H) 05/09/2016 1100   HDL 29.30 (L) 05/09/2016 1100   CHOLHDL 4 05/09/2016 1100   VLDL 64.4 (H) 05/09/2016 1100   LDLCALC 37 07/01/2014 0904   LDLDIRECT 61.0 05/09/2016 1100    Additional studies/ records that were reviewed today include:   As above   ASSESSMENT & PLAN:    1. Fatigue - Myoview is low risk. Medical management. His symptoms is likely from being over wight and deconditioning. Advised lifestyle changes with weigh loss, exercise and health healthy diet. Discussed with Dr. Excell Seltzer will continue same dose of coreg at 12.5mg  bid even thought baseline bradycardia. Given dyspnea on exertional will get echocardiogram. IF no improvement, will consider cardiac cath in future.   2. Sinus bradycardia -Stable. No dizziness or syncope. As above.    3. HTN - BP still evaluated. This is one reason we did not cut back on coreg. Will increase Norvasc to 10mg  qd. Continue hydralazine 25mg  TID and HCTZ 25mg  QD.   4. HLD with hypertriglyceridemia - 05/09/2016: Cholesterol 128; HDL 29.30; Triglycerides 322.0; VLDL 64.4 - Continue statin. Advised diet change and exercise.   5. CAD s/p DES to OM 2012  6. DM - A!c 7.3-->8.7. Per PCP.    Medication Adjustments/Labs and Tests Ordered: Current medicines are reviewed at length with the patient today.  Concerns regarding medicines are outlined above.  Medication changes, Labs and Tests ordered today are listed in the Patient Instructions below. Patient Instructions  Medication Instructions:  Your physician has recommended you make the following change in your medication:  1.  INCREASE the Amlodipine to 10 mg taking 1 tablet daily   Labwork: None ordered  Testing/Procedures: Your physician has requested that you have an echocardiogram. Echocardiography is a painless test that uses sound waves to create images of your heart. It provides your doctor with information about the size and shape of your heart and how well your heart's chambers and valves are working. This procedure takes approximately one hour. There are no restrictions for this procedure.    Follow-Up: Your physician recommends that you schedule a follow-up appointment in: 2 MONTHS WITH DR. Excell Seltzer   Any Other Special Instructions Will Be Listed Below (If Applicable).  Echocardiogram An echocardiogram, or echocardiography, uses sound waves (ultrasound) to produce an image of your heart. The echocardiogram is simple, painless, obtained within a short period of time, and offers valuable information to your health care provider. The images from an echocardiogram can provide information such as:  Evidence of coronary artery disease (CAD).  Heart size.  Heart muscle function.  Heart valve function.  Aneurysm  detection.  Evidence of a past heart attack.  Fluid buildup around the heart.  Heart muscle thickening.  Assess heart valve function. Tell  a health care provider about:  Any allergies you have.  All medicines you are taking, including vitamins, herbs, eye drops, creams, and over-the-counter medicines.  Any problems you or family members have had with anesthetic medicines.  Any blood disorders you have.  Any surgeries you have had.  Any medical conditions you have.  Whether you are pregnant or may be pregnant. What happens before the procedure? No special preparation is needed. Eat and drink normally. What happens during the procedure?  In order to produce an image of your heart, gel will be applied to your chest and a wand-like tool (transducer) will be moved over your chest. The gel will help transmit the sound waves from the transducer. The sound waves will harmlessly bounce off your heart to allow the heart images to be captured in real-time motion. These images will then be recorded.  You may need an IV to receive a medicine that improves the quality of the pictures. What happens after the procedure? You may return to your normal schedule including diet, activities, and medicines, unless your health care provider tells you otherwise. This information is not intended to replace advice given to you by your health care provider. Make sure you discuss any questions you have with your health care provider. Document Released: 07/06/2000 Document Revised: 02/25/2016 Document Reviewed: 03/16/2013 Elsevier Interactive Patient Education  2017 [employer]    If you need a refill on your cardiac medications before your next appointment, please call your pharmacy.      Lorelei PontSigned, Desaray Marschner, [place]  06/22/2016 10:43 AM    [facility] [address], [place], [place]  [address] Phone: [phone]; Fax: [phone]

## 2016-06-22 ENCOUNTER — Ambulatory Visit (INDEPENDENT_AMBULATORY_CARE_PROVIDER_SITE_OTHER): Payer: PPO | Admitting: Physician Assistant

## 2016-06-22 ENCOUNTER — Encounter: Payer: Self-pay | Admitting: Physician Assistant

## 2016-06-22 VITALS — BP 150/60 | HR 50 | Ht 67.0 in | Wt 228.5 lb

## 2016-06-22 DIAGNOSIS — E782 Mixed hyperlipidemia: Secondary | ICD-10-CM | POA: Diagnosis not present

## 2016-06-22 DIAGNOSIS — I2510 Atherosclerotic heart disease of native coronary artery without angina pectoris: Secondary | ICD-10-CM

## 2016-06-22 DIAGNOSIS — R0602 Shortness of breath: Secondary | ICD-10-CM | POA: Diagnosis not present

## 2016-06-22 DIAGNOSIS — I10 Essential (primary) hypertension: Secondary | ICD-10-CM

## 2016-06-22 DIAGNOSIS — R5383 Other fatigue: Secondary | ICD-10-CM | POA: Diagnosis not present

## 2016-06-22 DIAGNOSIS — R001 Bradycardia, unspecified: Secondary | ICD-10-CM

## 2016-06-22 MED ORDER — AMLODIPINE BESYLATE 10 MG PO TABS: 10.0000 mg | ORAL_TABLET | Freq: Every morning | ORAL | 3 refills | Status: DC

## 2016-06-22 NOTE — Patient Instructions (Signed)
Medication Instructions:  Your physician has recommended you make the following change in your medication:  1.  INCREASE the Amlodipine to 10 mg taking 1 tablet daily   Labwork: None ordered  Testing/Procedures: Your physician has requested that you have an echocardiogram. Echocardiography is a painless test that uses sound waves to create images of your heart. It provides your doctor with information about the size and shape of your heart and how well your heart's chambers and valves are working. This procedure takes approximately one hour. There are no restrictions for this procedure.    Follow-Up: Your physician recommends that you schedule a follow-up appointment in: 2 MONTHS WITH DR. Excell SeltzerOOPER   Any Other Special Instructions Will Be Listed Below (If Applicable).  Echocardiogram An echocardiogram, or echocardiography, uses sound waves (ultrasound) to produce an image of your heart. The echocardiogram is simple, painless, obtained within a short period of time, and offers valuable information to your health care provider. The images from an echocardiogram can provide information such as:  Evidence of coronary artery disease (CAD).  Heart size.  Heart muscle function.  Heart valve function.  Aneurysm detection.  Evidence of a past heart attack.  Fluid buildup around the heart.  Heart muscle thickening.  Assess heart valve function. Tell a health care provider about:  Any allergies you have.  All medicines you are taking, including vitamins, herbs, eye drops, creams, and over-the-counter medicines.  Any problems you or family members have had with anesthetic medicines.  Any blood disorders you have.  Any surgeries you have had.  Any medical conditions you have.  Whether you are pregnant or may be pregnant. What happens before the procedure? No special preparation is needed. Eat and drink normally. What happens during the procedure?  In order to produce an  image of your heart, gel will be applied to your chest and a wand-like tool (transducer) will be moved over your chest. The gel will help transmit the sound waves from the transducer. The sound waves will harmlessly bounce off your heart to allow the heart images to be captured in real-time motion. These images will then be recorded.  You may need an IV to receive a medicine that improves the quality of the pictures. What happens after the procedure? You may return to your normal schedule including diet, activities, and medicines, unless your health care provider tells you otherwise. This information is not intended to replace advice given to you by your health care provider. Make sure you discuss any questions you have with your health care provider. Document Released: 07/06/2000 Document Revised: 02/25/2016 Document Reviewed: 03/16/2013 Elsevier Interactive Patient Education  2017 [employer]    If you need a refill on your cardiac medications before your next appointment, please call your pharmacy.

## 2016-06-25 ENCOUNTER — Other Ambulatory Visit: Payer: Self-pay | Admitting: Cardiovascular Disease

## 2016-07-21 ENCOUNTER — Other Ambulatory Visit: Payer: Self-pay | Admitting: Family Medicine

## 2016-07-31 ENCOUNTER — Other Ambulatory Visit (HOSPITAL_COMMUNITY): Payer: PPO

## 2016-08-03 ENCOUNTER — Other Ambulatory Visit: Payer: Self-pay

## 2016-08-03 ENCOUNTER — Ambulatory Visit (HOSPITAL_COMMUNITY): Payer: PPO | Attending: Cardiology

## 2016-08-03 DIAGNOSIS — E119 Type 2 diabetes mellitus without complications: Secondary | ICD-10-CM | POA: Diagnosis not present

## 2016-08-03 DIAGNOSIS — I2510 Atherosclerotic heart disease of native coronary artery without angina pectoris: Secondary | ICD-10-CM | POA: Insufficient documentation

## 2016-08-03 DIAGNOSIS — R0602 Shortness of breath: Secondary | ICD-10-CM | POA: Diagnosis not present

## 2016-08-03 DIAGNOSIS — Z6835 Body mass index (BMI) 35.0-35.9, adult: Secondary | ICD-10-CM | POA: Diagnosis not present

## 2016-08-03 DIAGNOSIS — E785 Hyperlipidemia, unspecified: Secondary | ICD-10-CM | POA: Diagnosis not present

## 2016-08-03 DIAGNOSIS — I119 Hypertensive heart disease without heart failure: Secondary | ICD-10-CM | POA: Insufficient documentation

## 2016-08-03 DIAGNOSIS — E669 Obesity, unspecified: Secondary | ICD-10-CM | POA: Diagnosis not present

## 2016-09-03 ENCOUNTER — Encounter: Payer: Self-pay | Admitting: Cardiovascular Disease

## 2016-09-10 ENCOUNTER — Ambulatory Visit (INDEPENDENT_AMBULATORY_CARE_PROVIDER_SITE_OTHER): Payer: PPO | Admitting: Cardiovascular Disease

## 2016-09-10 ENCOUNTER — Encounter: Payer: Self-pay | Admitting: Cardiovascular Disease

## 2016-09-10 VITALS — BP 150/72 | HR 60 | Ht 67.5 in | Wt 231.0 lb

## 2016-09-10 DIAGNOSIS — I10 Essential (primary) hypertension: Secondary | ICD-10-CM | POA: Diagnosis not present

## 2016-09-10 DIAGNOSIS — I2510 Atherosclerotic heart disease of native coronary artery without angina pectoris: Secondary | ICD-10-CM

## 2016-09-10 DIAGNOSIS — E782 Mixed hyperlipidemia: Secondary | ICD-10-CM

## 2016-09-10 MED ORDER — HYDRALAZINE HCL 50 MG PO TABS: 50.0000 mg | ORAL_TABLET | Freq: Three times a day (TID) | ORAL | 3 refills | Status: DC

## 2016-09-10 MED ORDER — HYDRALAZINE HCL 50 MG PO TABS: 50.0000 mg | ORAL_TABLET | Freq: Two times a day (BID) | ORAL | 3 refills | Status: DC

## 2016-09-10 NOTE — Progress Notes (Signed)
Cardiology Office Note Date:  09/13/2016   ID:  Steve Wiggins, DOB November 08, 1940, MRN [id number]  PCP:  Steve RhymesKatherine Tabori, MD  Cardiologist:  Tonny Bollmanooper, Aum Caggiano, MD    Chief Complaint  Patient presents with  . Shortness of Breath   History of Present Illness: Steve CanningClarence M Wiggins is a 76 y.o. male who presents for follow-up of CAD, HTN, hyperlipidemia, diabetes, and exertional dyspnea.   He's been having a lot of trouble controlling his diabetes. Notes blood glucose has been > 300. He's been on a combination of Januvia, Amaryl, and Metformin.   He reports no change in his shortness of breath. He denies orthopnea, PND, or leg swelling. No chest pain or pressure.  Past Medical History:  Diagnosis Date  . Coronary artery disease    a. 07/2010 echo: nl EF;  b. 08/2010 Cath/PCI: Resolute DES to OM  . Diabetes mellitus    type 2  . GERD (gastroesophageal reflux disease)   . History of gastric ulcer   . History of gout   . History of head injury FELL OFF LADDER,  1999--  S/P DRAINAGE SUBDERMAL HEMATOMA--  NO RESIDUAL  . Hyperlipidemia   . Hypertension   . Mild carotid artery disease (HCC)    a. 07/2010 Carotid U/S: bilat ICA 0-35%   . Obesity   . Penile lesion   . Umbilical hernia     Past Surgical History:  Procedure Laterality Date  . CARDIOVASCULAR STRESS TEST  08-28-2010  [place]   EF 66%/ SMALL AREA PROXIMAL POSTEROLATERAL ISCHEMIA  . CHOLECYSTECTOMY  06/05/2012   Procedure: LAPAROSCOPIC CHOLECYSTECTOMY;  Surgeon: Emelia LoronMatthew Wakefield, MD;  [facility];  Service: General;  Laterality: N/A;  . CORONARY ANGIOPLASTY WITH STENT PLACEMENT  09-26-2011  ([place], [place])   DRUG-ELUTING STENT X1 TO FIRST OBTUSE MARGINAL BRANCH CIRCUMFLEX/ MODERATE LESION DIAGINAL BRANCH TREATED MEDICALLY  . SURG FOR DRAINAGE SUBDURAL  HEMATOMA  1999  . TRANSTHORACIC ECHOCARDIOGRAM  08-21-2010   LVF NORMAL/ MILD MITRAL AND TRICUSID REGURG.  . UMBILICAL HERNIA REPAIR  06/05/2012   Procedure:  HERNIA REPAIR UMBILICAL ADULT;  Surgeon: Emelia LoronMatthew Wakefield, MD;  [facility];  Service: General;  Laterality: N/A;  Primary Umbilical Hernia Repair    Current Outpatient Prescriptions  Medication Sig Dispense Refill  . amLODipine (NORVASC) 10 MG tablet Take 1 tablet (10 mg total) by mouth every morning. 90 tablet 3  . aspirin 81 MG tablet Take 81 mg by mouth every morning.     . carvedilol (COREG) 12.5 MG tablet Take 12.5 mg by mouth 2 (two) times daily with a meal.    . fenofibrate 160 MG tablet Take 1 tablet (160 mg total) by mouth daily. 30 tablet 6  . glimepiride (AMARYL) 4 MG tablet TAKE 1 TABLET (4 MG TOTAL) BY MOUTH DAILY BEFORE BREAKFAST. 90 tablet 1  . hydrALAZINE (APRESOLINE) 50 MG tablet Take 1 tablet (50 mg total) by mouth 2 (two) times daily. 180 tablet 3  . hydrochlorothiazide (HYDRODIURIL) 25 MG tablet Take 1 tablet (25 mg total) by mouth daily. 90 tablet 3  . IBUPROFEN PO Take by mouth. PRN FOR PAIN    . Lancets (ONETOUCH ULTRASOFT) lancets PT TESTS SUGARS TWICE DAILY.DX. E11.40 100 each 2  . levothyroxine (SYNTHROID, LEVOTHROID) 50 MCG tablet TAKE 1 TABLET (50 MCG TOTAL) BY MOUTH DAILY. 90 tablet 3  . lisinopril (PRINIVIL,ZESTRIL) 40 MG tablet Take 40 mg by mouth daily.    . metFORMIN (GLUCOPHAGE) 500 MG tablet Take by  mouth 2 (two) times daily with a meal.    . omeprazole (PRILOSEC) 40 MG capsule TAKE 1 CAPSULE BY MOUTH EVERY EVENING. 90 capsule 1  . ONE TOUCH ULTRA TEST test strip USE TO CHECK SUGARS TWICE DAILY. DX E11.40 100 each 5  . rosuvastatin (CRESTOR) 10 MG tablet Take 1 tablet (10 mg total) by mouth daily. 90 tablet 3  . sitaGLIPtin (JANUVIA) 100 MG tablet Take 1 tablet (100 mg total) by mouth daily. 30 tablet 6  . tamsulosin (FLOMAX) 0.4 MG CAPS capsule Take 0.8 mg by mouth daily after supper.     . traZODone (DESYREL) 50 MG tablet Take 0.5-1 tablets (25-50 mg total) by mouth at bedtime as needed for sleep. 90 tablet 1  . triamcinolone ointment (KENALOG) 0.1 %  Apply 1 application topically 2 (two) times daily. 90 g 1   No current facility-administered medications for this visit.     Allergies:   Patient has no known allergies.   Social History:  The patient  reports that he has quit smoking. He has never used smokeless tobacco. He reports that he does not drink alcohol or use drugs.   Family History:  The patient's  family history includes Heart attack in his maternal grandmother and mother; Stroke in his mother.   ROS:  Please see the history of present illness.  Otherwise, review of systems is positive for weight gain, visual disturbance, diarrhea, snoring, wheezing, difficulty urinating.  All other systems are reviewed and negative.   PHYSICAL EXAM: VS:  BP (!) 150/72   Pulse 60   Ht 5' 7.5" (1.715 m)   Wt 231 lb (104.8 kg)   BMI 35.65 kg/m  , BMI Body mass index is 35.65 kg/m. GEN: Well nourished, overweight male, in no acute distress  HEENT: normal  Neck: no JVD, no masses. No carotid bruits Cardiac: RRR without murmur or gallop                Respiratory:  clear to auscultation bilaterally, normal work of breathing GI: soft, nontender, nondistended, + BS MS: no deformity or atrophy  Ext: no pretibial edema, pedal pulses 2+= bilaterally Skin: warm and dry, no rash Neuro:  Strength and sensation are intact Psych: euthymic mood, full affect  EKG:  EKG is not ordered today.  Recent Labs: 05/09/2016: ALT 18; BUN 20; Creatinine, Ser 0.91; Hemoglobin 14.8; Platelets 147.0; Potassium 4.8; Sodium 136; TSH 3.40   Lipid Panel     Component Value Date/Time   CHOL 128 05/09/2016 1100   TRIG 322.0 (H) 05/09/2016 1100   HDL 29.30 (L) 05/09/2016 1100   CHOLHDL 4 05/09/2016 1100   VLDL 64.4 (H) 05/09/2016 1100   LDLCALC 37 07/01/2014 0904   LDLDIRECT 61.0 05/09/2016 1100      Wt Readings from Last 3 Encounters:  09/10/16 231 lb (104.8 kg)  06/22/16 228 lb 8 oz (103.6 kg)  06/05/16 228 lb (103.4 kg)     Cardiac Studies  Reviewed: Myoview scan 06-05-2016: Study Highlights     Nuclear stress EF: 53%.  There was no ST segment deviation noted during stress.  Defect 1: There is a small defect of moderate severity present in the apex location.  This is a low risk study.  The left ventricular ejection fraction is normal (55-65%).   Low risk stress nuclear study with mild apical ischemia; LV function mildly reduced to low normal (EF 53); mild global hypokinesis.     Echo 08-03-2016: Left ventricle:  The  cavity size was normal. Systolic function was normal. The estimated ejection fraction was 55%. Wall motion was normal; there were no regional wall motion abnormalities. The transmitral flow pattern was normal. The deceleration time of the early transmitral flow velocity was normal. The pulmonary vein flow pattern was normal. The tissue Doppler parameters were normal. Left ventricular diastolic function parameters were normal.  ------------------------------------------------------------------- Aortic valve:   Trileaflet; normal thickness leaflets. Mobility was not restricted.  Doppler:  Transvalvular velocity was within the normal range. There was no stenosis. There was no regurgitation.   ------------------------------------------------------------------- Aorta:  The aorta was normal, not dilated, and non-diseased. Aortic root: The aortic root was normal in size.  ------------------------------------------------------------------- Mitral valve:   Structurally normal valve.   Mobility was not restricted.  Doppler:  Transvalvular velocity was within the normal range. There was no evidence for stenosis. There was no significant regurgitation.  ------------------------------------------------------------------- Left atrium:  The atrium was mildly dilated.  ------------------------------------------------------------------- Atrial septum:  No defect or patent foramen ovale was identified.     ------------------------------------------------------------------- Right ventricle:  The cavity size was normal. Wall thickness was normal. Systolic function was normal.  ------------------------------------------------------------------- Pulmonic valve:    Doppler:  Transvalvular velocity was within the normal range. There was no evidence for stenosis. There was trivial regurgitation.  ------------------------------------------------------------------- Tricuspid valve:   Structurally normal valve.    Doppler: Transvalvular velocity was within the normal range. There was mild regurgitation.  ------------------------------------------------------------------- Pulmonary artery:   The main pulmonary artery was normal-sized. Systolic pressure was within the normal range.  ------------------------------------------------------------------- Right atrium:  The atrium was normal in size.  ------------------------------------------------------------------- Pericardium:  The pericardium was normal in appearance. There was no pericardial effusion.  ASSESSMENT AND PLAN: 1.  CAD, native vessel: no angina. Stress test low risk. Continue medical management.  2. Exertional dyspnea: I think obesity deconditioning is major issue. Lengthy discussion regarding lifestyle modification today.  3. HTN: uncontrolled. Increase hydralazine to 50 mg BID. Continue other medications. He is not able to comply with the mid-day hydralazine dose.   Current medicines are reviewed with the patient today.  The patient does not have concerns regarding medicines.  Labs/ tests ordered today include:  No orders of the defined types were placed in this encounter.   Disposition:   FU 6 months  Signed, Tonny Bollman, MD  09/13/2016 12:22 PM    [facility] [address] [place], [place], [place]  [address] Phone: [phone]; Fax: [phone]

## 2016-09-10 NOTE — Patient Instructions (Addendum)
Medication Instructions:  Your physician has recommended you make the following change in your medication:  1. INCREASE Hydralazine to 50mg  take one tablet by mouth two times per day  Labwork: No new orders.   Testing/Procedures: No new orders.   Follow-Up: Your physician wants you to follow-up in: 6 MONTHS with Dr Excell Seltzerooper.  You will receive a reminder letter in the mail two months in advance. If you don't receive a letter, please call our office to schedule the follow-up appointment.   Any Other Special Instructions Will Be Listed Below (If Applicable).     If you need a refill on your cardiac medications before your next appointment, please call your pharmacy.

## 2016-10-06 ENCOUNTER — Emergency Department (HOSPITAL_COMMUNITY): Payer: PPO

## 2016-10-06 ENCOUNTER — Encounter (HOSPITAL_COMMUNITY): Payer: Self-pay | Admitting: Emergency Medicine

## 2016-10-06 ENCOUNTER — Emergency Department (HOSPITAL_COMMUNITY)
Admission: EM | Admit: 2016-10-06 | Discharge: 2016-10-06 | Disposition: A | Discharge status: Home / Self Care | Payer: PPO | Attending: Emergency Medicine | Admitting: Emergency Medicine

## 2016-10-06 DIAGNOSIS — Y999 Unspecified external cause status: Secondary | ICD-10-CM | POA: Insufficient documentation

## 2016-10-06 DIAGNOSIS — E1140 Type 2 diabetes mellitus with diabetic neuropathy, unspecified: Secondary | ICD-10-CM | POA: Insufficient documentation

## 2016-10-06 DIAGNOSIS — R079 Chest pain, unspecified: Secondary | ICD-10-CM | POA: Diagnosis not present

## 2016-10-06 DIAGNOSIS — R739 Hyperglycemia, unspecified: Secondary | ICD-10-CM

## 2016-10-06 DIAGNOSIS — Z955 Presence of coronary angioplasty implant and graft: Secondary | ICD-10-CM | POA: Insufficient documentation

## 2016-10-06 DIAGNOSIS — Y929 Unspecified place or not applicable: Secondary | ICD-10-CM | POA: Insufficient documentation

## 2016-10-06 DIAGNOSIS — Z7982 Long term (current) use of aspirin: Secondary | ICD-10-CM | POA: Insufficient documentation

## 2016-10-06 DIAGNOSIS — Y9301 Activity, walking, marching and hiking: Secondary | ICD-10-CM | POA: Insufficient documentation

## 2016-10-06 DIAGNOSIS — E1165 Type 2 diabetes mellitus with hyperglycemia: Secondary | ICD-10-CM | POA: Insufficient documentation

## 2016-10-06 DIAGNOSIS — I2510 Atherosclerotic heart disease of native coronary artery without angina pectoris: Secondary | ICD-10-CM | POA: Diagnosis not present

## 2016-10-06 DIAGNOSIS — I10 Essential (primary) hypertension: Secondary | ICD-10-CM | POA: Diagnosis not present

## 2016-10-06 DIAGNOSIS — Z7984 Long term (current) use of oral hypoglycemic drugs: Secondary | ICD-10-CM | POA: Insufficient documentation

## 2016-10-06 DIAGNOSIS — E039 Hypothyroidism, unspecified: Secondary | ICD-10-CM | POA: Insufficient documentation

## 2016-10-06 DIAGNOSIS — S0083XA Contusion of other part of head, initial encounter: Secondary | ICD-10-CM | POA: Diagnosis not present

## 2016-10-06 DIAGNOSIS — W0110XA Fall on same level from slipping, tripping and stumbling with subsequent striking against unspecified object, initial encounter: Secondary | ICD-10-CM | POA: Diagnosis not present

## 2016-10-06 DIAGNOSIS — S0990XA Unspecified injury of head, initial encounter: Secondary | ICD-10-CM

## 2016-10-06 DIAGNOSIS — R1013 Epigastric pain: Secondary | ICD-10-CM | POA: Diagnosis not present

## 2016-10-06 DIAGNOSIS — R42 Dizziness and giddiness: Secondary | ICD-10-CM | POA: Diagnosis not present

## 2016-10-06 LAB — CBC WITH DIFFERENTIAL/PLATELET
BASOS ABS: 0 10*3/uL (ref 0.0–0.1)
BASOS PCT: 0 %
EOS ABS: 0.2 10*3/uL (ref 0.0–0.7)
Eosinophils Relative: 2 %
HCT: 38.9 % — ABNORMAL LOW (ref 39.0–52.0)
Hemoglobin: 13 g/dL (ref 13.0–17.0)
LYMPHS PCT: 18 %
Lymphs Abs: 1.7 10*3/uL (ref 0.7–4.0)
MCH: 29.4 pg (ref 26.0–34.0)
MCHC: 33.4 g/dL (ref 30.0–36.0)
MCV: 88 fL (ref 78.0–100.0)
Monocytes Absolute: 0.9 10*3/uL (ref 0.1–1.0)
Monocytes Relative: 9 %
Neutro Abs: 6.6 10*3/uL (ref 1.7–7.7)
Neutrophils Relative %: 71 %
PLATELETS: 158 10*3/uL (ref 150–400)
RBC: 4.42 MIL/uL (ref 4.22–5.81)
RDW: 13.2 % (ref 11.5–15.5)
WBC: 9.4 10*3/uL (ref 4.0–10.5)

## 2016-10-06 LAB — URINALYSIS, ROUTINE W REFLEX MICROSCOPIC
Bacteria, UA: NONE SEEN
Glucose, UA: >=500 mg/dL — AB
Hgb urine dipstick: NEGATIVE
KETONES UR: 5 mg/dL — AB
NITRITE: NEGATIVE
PH: 5 (ref 5.0–8.0)
PROTEIN: 100 mg/dL — AB
SPECIFIC GRAVITY, URINE: 1.028 (ref 1.005–1.030)

## 2016-10-06 LAB — BASIC METABOLIC PANEL
Anion gap: 7 (ref 5–15)
BUN: 14 mg/dL (ref 6–20)
CALCIUM: 9.1 mg/dL (ref 8.9–10.3)
CO2: 24 mmol/L (ref 22–32)
Chloride: 102 mmol/L (ref 101–111)
Creatinine, Ser: 1.21 mg/dL (ref 0.61–1.24)
GFR calc Af Amer: >60 mL/min (ref >60)
GFR, EST NON AFRICAN AMERICAN: 58 mL/min — AB (ref >60)
GLUCOSE: 302 mg/dL — AB (ref 65–99)
POTASSIUM: 3.8 mmol/L (ref 3.5–5.1)
Sodium: 133 mmol/L — ABNORMAL LOW (ref 135–145)

## 2016-10-06 NOTE — ED Notes (Signed)
Delay in EKG Pt not in the room.

## 2016-10-06 NOTE — ED Provider Notes (Signed)
[facility]-EM[facility]ENCY DEPT Provider Note   CSN: [id number] Arrival date & time: 10/06/16  1135     History   Chief Complaint Chief Complaint  Patient presents with  . Head Injury    HPI Steve Wiggins is a 76 y.o. male.  HPI    He presents for evaluation of injury to head several days ago.  He was walking and slipped in mud, injuring his head.  Since that time he has had headache, nausea, neck pain, and decreased appetite.  He feels that he has "gas" in his upper abdomen.  He denies fever, chills, productive cough, shortness of breath, dysuria or urinary frequency.  He is taking his usual medications.  He went to an urgent care who recommended that he come here for further evaluation.  There are no other known modifying factors.  Past Medical History:  Diagnosis Date  . Coronary artery disease    a. 07/2010 echo: nl EF;  b. 08/2010 Cath/PCI: Resolute DES to OM  . Diabetes mellitus    type 2  . GERD (gastroesophageal reflux disease)   . History of gastric ulcer   . History of gout   . History of head injury FELL OFF LADDER,  1999--  S/P DRAINAGE SUBDERMAL HEMATOMA--  NO RESIDUAL  . Hyperlipidemia   . Hypertension   . Mild carotid artery disease (HCC)    a. 07/2010 Carotid U/S: bilat ICA 0-35%   . Obesity   . Penile lesion   . Umbilical hernia     Patient Active Problem List   Diagnosis Date Noted  . Hypothyroidism 05/09/2016  . Physical exam 09/12/2015  . Skin lesion of scalp 06/09/2015  . Orthostatic hypotension 04/27/2015  . Grief 04/18/2015  . Toenail fungus 03/14/2015  . Diabetes mellitus with neuropathy (HCC) 03/24/2014  . Caregiver stress 03/24/2014  . UTI (lower urinary tract infection) 06/05/2012  . Gangrenous cholecystitis 06/02/2012  . Penile lesion 03/21/2012  . Coronary atherosclerosis of native coronary artery 05/21/2011  . Hypertension 05/21/2011  . Hyperlipidemia 05/21/2011    Past Surgical History:  Procedure Laterality Date  . CARDIOVASCULAR  STRESS TEST  08-28-2010  ([place], [place])   EF 66%/ SMALL AREA PROXIMAL POSTEROLATERAL ISCHEMIA  . CHOLECYSTECTOMY  06/05/2012   Procedure: LAPAROSCOPIC CHOLECYSTECTOMY;  Surgeon: Emelia Loron, MD;  Location: [facility] OR;  Service: General;  Laterality: N/A;  . CORONARY ANGIOPLASTY WITH STENT PLACEMENT  09-26-2011  ([place])   DRUG-ELUTING STENT X1 TO FIRST OBTUSE MARGINAL BRANCH CIRCUMFLEX/ MODERATE LESION DIAGINAL BRANCH TREATED MEDICALLY  . SURG FOR DRAINAGE SUBDURAL  HEMATOMA  1999  . TRANSTHORACIC ECHOCARDIOGRAM  08-21-2010   LVF NORMAL/ MILD MITRAL AND TRICUSID REGURG.  . UMBILICAL HERNIA REPAIR  06/05/2012   Procedure: HERNIA REPAIR UMBILICAL ADULT;  Surgeon: Emelia Loron, MD;  Location: [facility] OR;  Service: General;  Laterality: N/A;  Primary Umbilical Hernia Repair       Home Medications    Prior to Admission medications   Medication Sig Start Date End Date Taking? Authorizing Provider  acetaminophen (TYLENOL) 325 MG tablet Take 650 mg by mouth every 6 (six) hours as needed (pain).   Yes Historical Provider, MD  amLODipine (NORVASC) 10 MG tablet Take 1 tablet (10 mg total) by mouth every morning. 06/22/16  Yes Bhavinkumar Bhagat, PA  aspirin 81 MG tablet Take 81 mg by mouth every morning.    Yes Historical Provider, MD  fenofibrate 160 MG tablet Take 1 tablet (160 mg total) by mouth daily. 05/11/16  Yes  Sheliah Hatch, MD  glimepiride (AMARYL) 4 MG tablet TAKE 1 TABLET (4 MG TOTAL) BY MOUTH DAILY BEFORE BREAKFAST. 06/11/16  Yes Sheliah Hatch, MD  hydrALAZINE (APRESOLINE) 50 MG tablet Take 1 tablet (50 mg total) by mouth 2 (two) times daily. 09/10/16  Yes Tonny Bollman, MD  hydrochlorothiazide (HYDRODIURIL) 25 MG tablet Take 1 tablet (25 mg total) by mouth daily. 06/04/16  Yes Bhavinkumar Bhagat, PA  ibuprofen (ADVIL,MOTRIN) 200 MG tablet Take 400 mg by mouth every 6 (six) hours as needed (pain). PRN FOR PAIN    Yes Historical Provider, MD  levothyroxine (SYNTHROID,  LEVOTHROID) 50 MCG tablet TAKE 1 TABLET (50 MCG TOTAL) BY MOUTH DAILY. 03/05/16  Yes Sheliah Hatch, MD  lisinopril (PRINIVIL,ZESTRIL) 40 MG tablet Take 40 mg by mouth daily. 08/29/16  Yes Historical Provider, MD  metFORMIN (GLUCOPHAGE) 500 MG tablet Take by mouth 2 (two) times daily with a meal.   Yes Historical Provider, MD  omeprazole (PRILOSEC) 40 MG capsule TAKE 1 CAPSULE BY MOUTH EVERY EVENING. 07/24/16  Yes Sheliah Hatch, MD  rosuvastatin (CRESTOR) 10 MG tablet Take 1 tablet (10 mg total) by mouth daily. 06/04/16  Yes Bhavinkumar Bhagat, PA  sitaGLIPtin (JANUVIA) 100 MG tablet Take 1 tablet (100 mg total) by mouth daily. 05/11/16  Yes Sheliah Hatch, MD  tamsulosin (FLOMAX) 0.4 MG CAPS capsule Take 0.8 mg by mouth daily.  03/11/14  Yes Historical Provider, MD  Lancets (ONETOUCH ULTRASOFT) lancets PT TESTS SUGARS TWICE DAILY.DX. E11.40 06/12/16   Sheliah Hatch, MD  ONE TOUCH ULTRA TEST test strip USE TO CHECK SUGARS TWICE DAILY. DX E11.40 04/30/16   Sheliah Hatch, MD  traZODone (DESYREL) 50 MG tablet Take 0.5-1 tablets (25-50 mg total) by mouth at bedtime as needed for sleep. Patient not taking: Reported on 10/06/2016 12/14/15   Sheliah Hatch, MD    Family History Family History  Problem Relation Age of Onset  . Heart attack Mother   . Stroke Mother   . Heart attack Maternal Grandmother     Social History Social History  Substance Use Topics  . Smoking status: Former Games developer  . Smokeless tobacco: Never Used     Comment: quit over 50 yrs ago.  . Alcohol use No     Comment: previously had an occasional drink but nothing in 50 yrs.     Allergies   Patient has no known allergies.   Review of Systems Review of Systems  All other systems reviewed and are negative.    Physical Exam Updated Vital Signs BP (!) 158/71 (BP Location: Left Arm)   Pulse 63   Temp 97.7 F (36.5 C) (Oral)   Resp 12   SpO2 96%   Physical Exam  Constitutional: He is oriented  to person, place, and time. He appears well-developed. No distress.  Elderly, overweight.  HENT:  Head: Normocephalic.  Right Ear: External ear normal.  Left Ear: External ear normal.  Small contusion left forehead; without associated abrasion or laceration.  Eyes: Conjunctivae and EOM are normal. Pupils are equal, round, and reactive to light.  Neck: Normal range of motion and phonation normal. Neck supple.  Cardiovascular: Normal rate, regular rhythm and normal heart sounds.   Pulmonary/Chest: Effort normal and breath sounds normal. No respiratory distress. He has no wheezes. He exhibits no tenderness and no bony tenderness.  Abdominal: Soft. He exhibits no mass. There is tenderness (Left upper quadrant, mild). There is no rebound and no guarding.  Musculoskeletal: Normal range of motion.  Neurological: He is alert and oriented to person, place, and time. No cranial nerve deficit or sensory deficit. He exhibits normal muscle tone. Coordination normal.  Skin: Skin is warm, dry and intact.  Psychiatric: He has a normal mood and affect. His behavior is normal. Judgment and thought content normal.  Nursing note and vitals reviewed.    ED Treatments / Results  Labs (all labs ordered are listed, but only abnormal results are displayed) Labs Reviewed  BASIC METABOLIC PANEL - Abnormal; Notable for the following:       Result Value   Sodium 133 (*)    Glucose, Bld 302 (*)    GFR calc non Af Amer 58 (*)    All other components within normal limits  CBC WITH DIFFERENTIAL/PLATELET - Abnormal; Notable for the following:    HCT 38.9 (*)    All other components within normal limits  URINALYSIS, ROUTINE W REFLEX MICROSCOPIC - Abnormal; Notable for the following:    Color, Urine AMBER (*)    APPearance HAZY (*)    Glucose, UA >=500 (*)    Bilirubin Urine SMALL (*)    Ketones, ur 5 (*)    Protein, ur 100 (*)    Leukocytes, UA TRACE (*)    Squamous Epithelial / LPF 0-5 (*)    All other  components within normal limits    EKG  EKG Interpretation  Date/Time:  Saturday October 06 2016 15:45:23 EDT Ventricular Rate:  67 PR Interval:    QRS Duration: 91 QT Interval:  397 QTC Calculation: 420 R Axis:   16 Text Interpretation:  Sinus rhythm Multiple ventricular premature complexes Since last tracing PVC are new Confirmed by Effie Shy  MD, Madasyn Heath [phone]) on 10/06/2016 3:56:04 PM       Radiology Dg Chest 2 View  Result Date: 10/06/2016 CLINICAL DATA:  Anterior chest pain status post syncopal episode today. EXAM: CHEST  2 VIEW COMPARISON:  06/07/2012 FINDINGS: Cardiomediastinal silhouette is normal. Mediastinal contours appear intact. There is no evidence of focal airspace consolidation, pleural effusion or pneumothorax. Chronic elevation right hemidiaphragm. Osseous structures are without acute abnormality. Soft tissues are grossly normal. IMPRESSION: No active cardiopulmonary disease. Electronically Signed   By: Ted Mcalpine M.D.   On: 10/06/2016 15:28   Ct Head Wo Contrast  Result Date: 10/06/2016 CLINICAL DATA:  Dizziness.  Larey Seat and hit his head 3 days ago. EXAM: CT HEAD WITHOUT CONTRAST TECHNIQUE: Contiguous axial images were obtained from the base of the skull through the vertex without intravenous contrast. COMPARISON:  Report dated 04/29/1998 FINDINGS: Brain: Diffusely enlarged ventricles and subarachnoid spaces. No intracranial hemorrhage, mass lesion or CT evidence of acute infarction. Vascular: No hyperdense vessel or unexpected calcification. Skull: Small scalloped defect in the outer portion of the left frontal bone compatible with an old burr hole. Sinuses/Orbits: No acute finding. Other: None. IMPRESSION: No acute abnormality. Moderate diffuse cerebral and cerebellar atrophy. Electronically Signed   By: Beckie Salts M.D.   On: 10/06/2016 15:36    Procedures Procedures (including critical care time)  Medications Ordered in ED Medications - No data to  display   Initial Impression / Assessment and Plan / ED Course  I have reviewed the triage vital signs and the nursing notes.  Pertinent labs & imaging results that were available during my care of the patient were reviewed by me and considered in my medical decision making (see chart for details).     Medications - No  data to display  No data found.   4:34 PM Reevaluation with update and discussion. After initial assessment and treatment, an updated evaluation reveals no change in clinical status.  He remains comfortable.  Findings discussed with patient and all questions answered. Max Nuno L    Final Clinical Impressions(s) / ED Diagnoses   Final diagnoses:  Injury of head, initial encounter  Hyperglycemia    Nonspecific symptoms following mild head injury.  No intracranial bleeding or signs for concussion.  Ongoing hyperglycemia, without signs of metabolic instability, DKA or impending vascular collapse.  Mild hypertension, currently under treatment.  Doubt hypertensive urgency.  Nursing Notes Reviewed/ Care Coordinated Applicable Imaging Reviewed Interpretation of Laboratory Data incorporated into ED treatment  The patient appears reasonably screened and/or stabilized for discharge and I doubt any other medical condition or other [facility] requiring further screening, evaluation, or treatment in the ED at this time prior to discharge.  Plan: Home Medications-continue usual medications; Home Treatments-rest, increase fluids especially water in  diet; return here if the recommended treatment, does not improve the symptoms; Recommended follow up-PCP checkup 2 or 3 days   New Prescriptions Discharge Medication List as of 10/06/2016  4:44 PM       Mancel BaleElliott Neila Teem, MD 10/07/16 [phone]

## 2016-10-06 NOTE — ED Provider Notes (Signed)
  Physical Exam  BP (!) 158/71 (BP Location: Left Arm)   Pulse 63   Temp 97.7 F (36.5 C) (Oral)   Resp 12   SpO2 96%   Physical Exam  ED Course  Procedures  MDM Urine negative for infection. Will d/c       Benjiman CoreNathan Shaleena Crusoe, MD 10/06/16 [phone]

## 2016-10-06 NOTE — ED Notes (Signed)
Bed: [id number] Expected date: 10/06/16 Expected time: 11:48 AM Means of arrival:  Comments: fall

## 2016-10-06 NOTE — Discharge Instructions (Signed)
Try to drink an extra 1-2 L of water each day; to help lower your blood sugar.  Continue to watch your carbohydrate intake.

## 2016-10-06 NOTE — ED Triage Notes (Addendum)
Patient here from urgent care with complaints of fall on Wednesday. Reports hitting head, states that he was concerned. Dizziness. Denies nausea/vomiting. History of diabetes. Reports sugars have been in the 300s.

## 2016-10-06 NOTE — ED Notes (Signed)
Gave Pt diet gingerale for Fluid/PO Challenge.

## 2016-10-10 ENCOUNTER — Encounter: Payer: Self-pay | Admitting: Family Medicine

## 2016-10-10 ENCOUNTER — Ambulatory Visit (INDEPENDENT_AMBULATORY_CARE_PROVIDER_SITE_OTHER): Payer: PPO | Admitting: Family Medicine

## 2016-10-10 VITALS — BP 126/86 | HR 71 | Temp 98.1°F | Resp 16 | Ht 68.0 in | Wt 229.0 lb

## 2016-10-10 DIAGNOSIS — E1140 Type 2 diabetes mellitus with diabetic neuropathy, unspecified: Secondary | ICD-10-CM

## 2016-10-10 DIAGNOSIS — E782 Mixed hyperlipidemia: Secondary | ICD-10-CM | POA: Diagnosis not present

## 2016-10-10 DIAGNOSIS — I10 Essential (primary) hypertension: Secondary | ICD-10-CM | POA: Diagnosis not present

## 2016-10-10 LAB — BASIC METABOLIC PANEL
BUN: 24 mg/dL — ABNORMAL HIGH (ref 6–23)
CO2: 26 mEq/L (ref 19–32)
Calcium: 9.8 mg/dL (ref 8.4–10.5)
Chloride: 103 mEq/L (ref 96–112)
Creatinine, Ser: 1.07 mg/dL (ref 0.40–1.50)
GFR: 71.95 mL/min (ref >60.00)
Glucose, Bld: 215 mg/dL — ABNORMAL HIGH (ref 70–99)
POTASSIUM: 4.6 meq/L (ref 3.5–5.1)
SODIUM: 136 meq/L (ref 135–145)

## 2016-10-10 LAB — HEPATIC FUNCTION PANEL
ALK PHOS: 40 U/L (ref 39–117)
ALT: 20 U/L (ref 0–53)
AST: 18 U/L (ref 0–37)
Albumin: 4.1 g/dL (ref 3.5–5.2)
BILIRUBIN DIRECT: 0.1 mg/dL (ref 0.0–0.3)
BILIRUBIN TOTAL: 0.4 mg/dL (ref 0.2–1.2)
TOTAL PROTEIN: 6.6 g/dL (ref 6.0–8.3)

## 2016-10-10 LAB — LIPID PANEL
Cholesterol: 107 mg/dL (ref 0–200)
HDL: 27.4 mg/dL — ABNORMAL LOW (ref >39.00)
NONHDL: 80.06
TRIGLYCERIDES: 207 mg/dL — AB (ref 0.0–149.0)
Total CHOL/HDL Ratio: 4
VLDL: 41.4 mg/dL — ABNORMAL HIGH (ref 0.0–40.0)

## 2016-10-10 LAB — CBC WITH DIFFERENTIAL/PLATELET
BASOS PCT: 0.8 % (ref 0.0–3.0)
Basophils Absolute: 0.1 10*3/uL (ref 0.0–0.1)
EOS ABS: 0.3 10*3/uL (ref 0.0–0.7)
EOS PCT: 3.6 % (ref 0.0–5.0)
HCT: 39.6 % (ref 39.0–52.0)
Hemoglobin: 13.4 g/dL (ref 13.0–17.0)
Lymphocytes Relative: 23.1 % (ref 12.0–46.0)
Lymphs Abs: 1.7 10*3/uL (ref 0.7–4.0)
MCHC: 33.8 g/dL (ref 30.0–36.0)
MCV: 90.4 fl (ref 78.0–100.0)
MONO ABS: 0.6 10*3/uL (ref 0.1–1.0)
Monocytes Relative: 8.4 % (ref 3.0–12.0)
NEUTROS ABS: 4.6 10*3/uL (ref 1.4–7.7)
Neutrophils Relative %: 64.1 % (ref 43.0–77.0)
PLATELETS: 167 10*3/uL (ref 150.0–400.0)
RBC: 4.38 Mil/uL (ref 4.22–5.81)
RDW: 13.4 % (ref 11.5–15.5)
WBC: 7.2 10*3/uL (ref 4.0–10.5)

## 2016-10-10 LAB — HEMOGLOBIN A1C: Hgb A1c MFr Bld: 9.3 % — ABNORMAL HIGH (ref 4.6–6.5)

## 2016-10-10 LAB — LDL CHOLESTEROL, DIRECT: LDL DIRECT: 56 mg/dL

## 2016-10-10 LAB — TSH: TSH: 3.38 u[IU]/mL (ref 0.35–4.50)

## 2016-10-10 NOTE — Assessment & Plan Note (Signed)
Pt's BP at end of visit is excellently controlled on Hydralazine, Lisinopril, Amlodipine, HCTZ.  Asymptomatic at this time.  Check labs.  No anticipated med changes.

## 2016-10-10 NOTE — Progress Notes (Signed)
   Subjective:    Patient ID: Steve CanningClarence M Shiller, male    DOB: Apr 24, 1941, 76 y.o.   MRN: [id number]  HPI ER f/u- pt was seen 3/17 after falling in the mud and hitting his head.  CT and chest xray were WNL.  ER exam was unremarkable and pt was sent home.  Sugar was 302.  Pt is on Amaryl, Metformin 1/2 tab qAM and 1 tab qPM, Januvia.  Due for A1C.  UTD on foot exam, eye exam.  On ACE for renal protection.  On Crestor for lipid control.  No HAs, visual changes, CP, SOB, abd pain, N/V.   Review of Systems For ROS see HPI     Objective:   Physical Exam  Constitutional: He is oriented to person, place, and time. He appears well-developed and well-nourished. No distress.  obese  HENT:  Head: Normocephalic and atraumatic.  Eyes: Conjunctivae and EOM are normal. Pupils are equal, round, and reactive to light.  Neck: Normal range of motion. Neck supple. No thyromegaly present.  Cardiovascular: Normal rate, regular rhythm, normal heart sounds and intact distal pulses.   No murmur heard. Pulmonary/Chest: Effort normal and breath sounds normal. No respiratory distress.  Abdominal: Soft. Bowel sounds are normal. He exhibits distension (mildly distended). There is no tenderness. There is no rebound and no guarding.  Musculoskeletal: He exhibits no edema.  Lymphadenopathy:    He has no cervical adenopathy.  Neurological: He is alert and oriented to person, place, and time. No cranial nerve deficit.  Skin: Skin is warm and dry.  Psychiatric: He has a normal mood and affect. His behavior is normal.  Vitals reviewed.         Assessment & Plan:

## 2016-10-10 NOTE — Assessment & Plan Note (Signed)
Chronic problem.  Tolerating statin w/o difficulty.  Check labs.  Adjust meds prn  

## 2016-10-10 NOTE — Progress Notes (Signed)
Pre visit review using our clinic review tool, if applicable. No additional management support is needed unless otherwise documented below in the visit note. 

## 2016-10-10 NOTE — Assessment & Plan Note (Signed)
Chronic problem.  UTD on foot exam, eye exam.  On ACE for renal protection.  Glucose in ER was 302. Pt is concerned that sugars are 'out of control'.  On Amaryl, Metformin, Januvia.  Is willing to consider and injxn like Victoza.  Check labs.  Adjust meds prn

## 2016-10-10 NOTE — Patient Instructions (Signed)
Follow up as scheduled We'll notify you of your lab results and make any changes if needed Continue to work on healthy diet and regular exercise- you can do it!!! Call with any questions or concerns Hang in there!  We'll figure this out!!!

## 2016-10-11 ENCOUNTER — Other Ambulatory Visit: Payer: Self-pay | Admitting: General Practice

## 2016-10-11 MED ORDER — DULAGLUTIDE 0.75 MG/0.5ML ~~LOC~~ SOAJ: 0.7500 mg | SUBCUTANEOUS | 3 refills | Status: DC

## 2016-10-22 DIAGNOSIS — Z125 Encounter for screening for malignant neoplasm of prostate: Secondary | ICD-10-CM | POA: Diagnosis not present

## 2016-10-22 DIAGNOSIS — R351 Nocturia: Secondary | ICD-10-CM | POA: Diagnosis not present

## 2016-10-22 DIAGNOSIS — R3912 Poor urinary stream: Secondary | ICD-10-CM | POA: Diagnosis not present

## 2016-10-22 DIAGNOSIS — N401 Enlarged prostate with lower urinary tract symptoms: Secondary | ICD-10-CM | POA: Diagnosis not present

## 2016-10-22 LAB — PSA: PSA: 1.36

## 2016-10-31 ENCOUNTER — Encounter: Payer: Self-pay | Admitting: Family Medicine

## 2016-10-31 ENCOUNTER — Ambulatory Visit (INDEPENDENT_AMBULATORY_CARE_PROVIDER_SITE_OTHER): Payer: PPO | Admitting: Family Medicine

## 2016-10-31 VITALS — BP 130/82 | HR 72 | Temp 98.1°F | Resp 16 | Ht 68.0 in | Wt 229.5 lb

## 2016-10-31 DIAGNOSIS — Z0000 Encounter for general adult medical examination without abnormal findings: Secondary | ICD-10-CM | POA: Diagnosis not present

## 2016-10-31 DIAGNOSIS — H6983 Other specified disorders of Eustachian tube, bilateral: Secondary | ICD-10-CM | POA: Diagnosis not present

## 2016-10-31 MED ORDER — FLUTICASONE PROPIONATE 50 MCG/ACT NA SUSP: 2.0000 | Freq: Every day | NASAL | 6 refills | Status: DC

## 2016-10-31 NOTE — Patient Instructions (Addendum)
Follow up in late June or July to recheck diabetes Start the [facility]- 2 sprays each nostril once daily- to improve the ear fullness Drink plenty of water Call with any questions or concerns [place]!  Make appointment with eye doctor.   Bring a copy of your advance directives to your next office visit.  Continue doing brain stimulating activities (puzzles, reading, adult coloring books, staying active) to keep memory sharp.    Health Maintenance, Male A healthy lifestyle and preventive care is important for your health and wellness. Ask your health care provider about what schedule of regular examinations is right for you. What should I know about weight and diet?  Eat a Healthy Diet  Eat plenty of vegetables, fruits, whole grains, low-fat dairy products, and lean protein.  Do not eat a lot of foods high in solid fats, added sugars, or salt. Maintain a Healthy Weight  Regular exercise can help you achieve or maintain a healthy weight. You should:  Do at least 150 minutes of exercise each week. The exercise should increase your heart rate and make you sweat (moderate-intensity exercise).  Do strength-training exercises at least twice a week. Watch Your Levels of Cholesterol and Blood Lipids  Have your blood tested for lipids and cholesterol every 5 years starting at 74 years of age. If you are at high risk for heart disease, you should start having your blood tested when you are 74 years old. You may need to have your cholesterol levels checked more often if:  Your lipid or cholesterol levels are high.  You are older than 74 years of age.  You are at high risk for heart disease. What should I know about cancer screening? Many types of cancers can be detected early and may often be prevented. Lung Cancer  You should be screened every year for lung cancer if:  You are a current smoker who has smoked for at least 30 years.  You are a former smoker who has quit within the  past 15 years.  Talk to your health care provider about your screening options, when you should start screening, and how often you should be screened. Colorectal Cancer  Routine colorectal cancer screening usually begins at 74 years of age and should be repeated every 5-10 years until you are 74 years old. You may need to be screened more often if early forms of precancerous polyps or small growths are found. Your health care provider may recommend screening at an earlier age if you have risk factors for colon cancer.  Your health care provider may recommend using home test kits to check for hidden blood in the stool.  A small camera at the end of a tube can be used to examine your colon (sigmoidoscopy or colonoscopy). This checks for the earliest forms of colorectal cancer. Prostate and Testicular Cancer  Depending on your age and overall health, your health care provider may do certain tests to screen for prostate and testicular cancer.  Talk to your health care provider about any symptoms or concerns you have about testicular or prostate cancer. Skin Cancer  Check your skin from head to toe regularly.  Tell your health care provider about any new moles or changes in moles, especially if:  There is a change in a mole's size, shape, or color.  You have a mole that is larger than a pencil eraser.  Always use sunscreen. Apply sunscreen liberally and repeat throughout the day.  Protect yourself by wearing long sleeves,  pants, a wide-brimmed hat, and sunglasses when outside. What should I know about heart disease, diabetes, and high blood pressure?  If you are 50-31 years of age, have your blood pressure checked every 3-5 years. If you are 55 years of age or older, have your blood pressure checked every year. You should have your blood pressure measured twice-once when you are at a hospital or clinic, and once when you are not at a hospital or clinic. Record the average of the two  measurements. To check your blood pressure when you are not at a hospital or clinic, you can use:  An automated blood pressure machine at a pharmacy.  A home blood pressure monitor.  Talk to your health care provider about your target blood pressure.  If you are between 6-62 years old, ask your health care provider if you should take aspirin to prevent heart disease.  Have regular diabetes screenings by checking your fasting blood sugar level.  If you are at a normal weight and have a low risk for diabetes, have this test once every three years after the age of 50.  If you are overweight and have a high risk for diabetes, consider being tested at a younger age or more often.  A one-time screening for abdominal aortic aneurysm (AAA) by ultrasound is recommended for men aged 65-75 years who are current or former smokers. What should I know about preventing infection? Hepatitis B  If you have a higher risk for hepatitis B, you should be screened for this virus. Talk with your health care provider to find out if you are at risk for hepatitis B infection. Hepatitis C  Blood testing is recommended for:  Everyone born from [birth year] through 1963.  Anyone with known risk factors for hepatitis C. Sexually Transmitted Diseases (STDs)  You should be screened each year for STDs including gonorrhea and chlamydia if:  You are sexually active and are younger than 74 years of age.  You are older than 74 years of age and your health care provider tells you that you are at risk for this type of infection.  Your sexual activity has changed since you were last screened and you are at an increased risk for chlamydia or gonorrhea. Ask your health care provider if you are at risk.  Talk with your health care provider about whether you are at high risk of being infected with HIV. Your health care provider may recommend a prescription medicine to help prevent HIV infection. What else can I do?  Schedule  regular health, dental, and eye exams.  Stay current with your vaccines (immunizations).  Do not use any tobacco products, such as cigarettes, chewing tobacco, and e-cigarettes. If you need help quitting, ask your health care provider.  Limit alcohol intake to no more than 2 drinks per day. One drink equals 12 ounces of beer, 5 ounces of wine, or 1 ounces of hard liquor.  Do not use street drugs.  Do not share needles.  Ask your health care provider for help if you need support or information about quitting drugs.  Tell your health care provider if you often feel depressed.  Tell your health care provider if you have ever been abused or do not feel safe at home. This information is not intended to replace advice given to you by your health care provider. Make sure you discuss any questions you have with your health care provider. Document Released: 01/05/2008 Document Revised: 03/07/2016 Document Reviewed: 04/12/2015 Elsevier Interactive  Patient Education  2017 [employer]vier Inc.  

## 2016-10-31 NOTE — Progress Notes (Signed)
Pre visit review using our clinic review tool, if applicable. No additional management support is needed unless otherwise documented below in the visit note. 

## 2016-10-31 NOTE — Progress Notes (Signed)
   Subjective:    Patient ID: Steve Wiggins, male    DOB: 01/12/1941, 76 y.o.   MRN: [id number]  HPI Ear fullness- 'like your in the mountains'.  sxs started 'a couple days ago'.  Pt has hx of 2 other episodes that were both ear wax issues.  No drainage from ears.  No change in hearing.   Review of Systems For ROS see HPI     Objective:   Physical Exam  Constitutional: He is oriented to person, place, and time. He appears well-developed and well-nourished. No distress.  HENT:  Head: Normocephalic and atraumatic.  No TTP over sinuses + turbinate edema + PND TMs retracted bilaterally  Eyes: Conjunctivae and EOM are normal. Pupils are equal, round, and reactive to light.  Neck: Normal range of motion. Neck supple.  Cardiovascular: Normal rate, regular rhythm and normal heart sounds.   Pulmonary/Chest: Effort normal and breath sounds normal. No respiratory distress. He has no wheezes.  Lymphadenopathy:    He has no cervical adenopathy.  Neurological: He is alert and oriented to person, place, and time.  Skin: Skin is warm and dry.  Psychiatric: He has a normal mood and affect. His behavior is normal. Thought content normal.  Vitals reviewed.         Assessment & Plan:  Eustachian tube dysfunction- new.  Pt has retracted TMs bilaterally.  Explained dx and tx options.  Will avoid antihistamine due to age and increased risk of falls.  Start nasal steroid spray.  Reviewed supportive care and red flags that should prompt return.  Pt expressed understanding and is in agreement w/ plan.

## 2016-10-31 NOTE — Progress Notes (Addendum)
Subjective:   Steve Wiggins is a 76 y.o. male who presents for Medicare Annual/Subsequent preventive examination.  Review of Systems:  No ROS.  Medicare Wellness Visit.  Cardiac Risk Factors include: advanced age (>75men, >2 women);family history of premature cardiovascular disease;dyslipidemia;hypertension;male gender;obesity (BMI >30kg/m2);diabetes mellitus   Sleep patterns: Sleeps 6 hours. Up to void x 6.  Home Safety/Smoke Alarms: Smoke detectors and security in place.   Living environment; residence and Firearm Safety: Lives alone (granddaughter stays at night) in 1 story home, 3 steps with rail at door. Feels safe in home. Firearms locked away.  Seat Belt Safety/Bike Helmet: Wears seat belt.   Counseling:   Eye Exam-Last exam 04/2016, yearly by [facility]. Will make appointment.   Dental-Last exam 2014. Only with issues. Dental resources provided.    Male:   CCS-Colonoscopy 08/14/2011, pt reports normal.      PSA-09/12/2015, 1.440. Followed by Urology.      Objective:    Vitals: BP 130/82   Pulse 72   Temp 98.1 F (36.7 C) (Oral)   Resp 16   Ht  (1.727 m)   Wt 229 lb 8 oz (104.1 kg)   SpO2 98%   BMI 34.90 kg/m   Body mass index is 34.9 kg/m.  Tobacco History  Smoking Status  . Former Smoker  Smokeless Tobacco  . Never Used    Comment: quit over 50 yrs ago.     Counseling given: Yes   Past Medical History:  Diagnosis Date  . Coronary artery disease    a. 07/2010 echo: nl EF;  b. 08/2010 Cath/PCI: Resolute DES to OM  . Diabetes mellitus    type 2  . GERD (gastroesophageal reflux disease)   . History of gastric ulcer   . History of gout   . History of head injury FELL OFF LADDER,  1999--  S/P DRAINAGE SUBDERMAL HEMATOMA--  NO RESIDUAL  . Hyperlipidemia   . Hypertension   . Mild carotid artery disease (HCC)    a. 07/2010 Carotid U/S: bilat ICA 0-35%   . Obesity   . Penile lesion   . Umbilical hernia    Past Surgical History:  Procedure  Laterality Date  . CARDIOVASCULAR STRESS TEST  08-28-2010  ([place], [place])   EF 66%/ SMALL AREA PROXIMAL POSTEROLATERAL ISCHEMIA  . CHOLECYSTECTOMY  06/05/2012   Procedure: LAPAROSCOPIC CHOLECYSTECTOMY;  Surgeon: Emelia Loron, MD;  Location: [facility] OR;  Service: General;  Laterality: N/A;  . CORONARY ANGIOPLASTY WITH STENT PLACEMENT  09-26-2011  ([place], [place])   DRUG-ELUTING STENT X1 TO FIRST OBTUSE MARGINAL BRANCH CIRCUMFLEX/ MODERATE LESION DIAGINAL BRANCH TREATED MEDICALLY  . SURG FOR DRAINAGE SUBDURAL  HEMATOMA  1999  . TRANSTHORACIC ECHOCARDIOGRAM  08-21-2010   LVF NORMAL/ MILD MITRAL AND TRICUSID REGURG.  . UMBILICAL HERNIA REPAIR  06/05/2012   Procedure: HERNIA REPAIR UMBILICAL ADULT;  Surgeon: Emelia Loron, MD;  Location: [facility] OR;  Service: General;  Laterality: N/A;  Primary Umbilical Hernia Repair   Family History  Problem Relation Age of Onset  . Heart attack Mother   . Stroke Mother   . Heart attack Maternal Grandmother    History  Sexual Activity  . Sexual activity: Not on file    Outpatient Encounter Prescriptions as of 10/31/2016  Medication Sig  . acetaminophen (TYLENOL) 325 MG tablet Take 650 mg by mouth every 6 (six) hours as needed (pain).  Marland Kitchen amLODipine (NORVASC) 10 MG tablet Take 1 tablet (10 mg total) by mouth every morning.  Marland Kitchen  aspirin 81 MG tablet Take 81 mg by mouth every morning.   . Dulaglutide (TRULICITY) 0.75 MG/0.5ML SOPN Inject 0.75 mg into the skin once a week.  . fenofibrate 160 MG tablet Take 1 tablet (160 mg total) by mouth daily.  Marland Kitchen glimepiride (AMARYL) 4 MG tablet TAKE 1 TABLET (4 MG TOTAL) BY MOUTH DAILY BEFORE BREAKFAST.  . hydrALAZINE (APRESOLINE) 50 MG tablet Take 1 tablet (50 mg total) by mouth 2 (two) times daily.  . hydrochlorothiazide (HYDRODIURIL) 25 MG tablet Take 1 tablet (25 mg total) by mouth daily.  Marland Kitchen ibuprofen (ADVIL,MOTRIN) 200 MG tablet Take 400 mg by mouth every 6 (six) hours as needed (pain). PRN FOR PAIN   . Lancets (ONETOUCH  ULTRASOFT) lancets PT TESTS SUGARS TWICE DAILY.DX. E11.40  . levothyroxine (SYNTHROID, LEVOTHROID) 50 MCG tablet TAKE 1 TABLET (50 MCG TOTAL) BY MOUTH DAILY.  Marland Kitchen lisinopril (PRINIVIL,ZESTRIL) 40 MG tablet Take 40 mg by mouth daily.  . metFORMIN (GLUCOPHAGE) 500 MG tablet Take by mouth 2 (two) times daily with a meal.  . omeprazole (PRILOSEC) 40 MG capsule TAKE 1 CAPSULE BY MOUTH EVERY EVENING.  . ONE TOUCH ULTRA TEST test strip USE TO CHECK SUGARS TWICE DAILY. DX E11.40  . rosuvastatin (CRESTOR) 10 MG tablet Take 1 tablet (10 mg total) by mouth daily.  . sitaGLIPtin (JANUVIA) 100 MG tablet Take 1 tablet (100 mg total) by mouth daily.  . tamsulosin (FLOMAX) 0.4 MG CAPS capsule Take 0.8 mg by mouth daily.   . traZODone (DESYREL) 50 MG tablet Take 0.5-1 tablets (25-50 mg total) by mouth at bedtime as needed for sleep.  . fluticasone (FLONASE) 50 MCG/ACT nasal spray Place 2 sprays into both nostrils daily.   No facility-administered encounter medications on file as of 10/31/2016.     Activities of Daily Living In your present state of health, do you have any difficulty performing the following activities: 10/31/2016 10/31/2016  Hearing? N N  Vision? N N  Difficulty concentrating or making decisions? Y N  Walking or climbing stairs? N N  Dressing or bathing? N N  Doing errands, shopping? N N  Preparing Food and eating ? N -  Using the Toilet? N -  In the past six months, have you accidently leaked urine? N -  Do you have problems with loss of bowel control? N -  Managing your Medications? N -  Managing your Finances? N -  Housekeeping or managing your Housekeeping? N -  Some recent data might be hidden    Patient Care Team: Sheliah Hatch, MD as PCP - General (Family Medicine) Tonny Bollman, MD as Attending Physician (Cardiology) Ihor Gully, MD as Consulting Physician (Urology)   Assessment:    Physical assessment deferred to PCP.  Exercise Activities and Dietary  recommendations Current Exercise Habits: Home exercise routine (fishing, walks park to metal detect), Type of exercise: walking, Time (Minutes): 60, Frequency (Times/Week): 2, Weekly Exercise (Minutes/Week): 120, Exercise limited by: respiratory conditions(s) (SOB occasionally)   Diet (meal preparation, eat out, water intake, caffeinated beverages, dairy products, fruits and vegetables): Drinks water.   Breakfast: Frozen breakfast sandwich, coffee (3-4 cups) Lunch: steamed vegetables, egg, deli meat Dinner: Baked potato, vegetables, baked chicken   Discussed heart healthy/low carb diet and increasing activity.   Goals      Patient Stated   . patient  (pt-stated)          Lower A1C by losing weight, by increasing walking.       Fall Risk  Fall Risk  10/31/2016 01/10/2016 09/12/2015 07/01/2014 03/17/2014  Falls in the past year? Yes No No No No  Number falls in past yr: 1 - - - -  Injury with Fall? No - - - -   Depression Screen PHQ 2/9 Scores 10/31/2016 10/31/2016 01/10/2016 09/12/2015  PHQ - 2 Score 2 0 0 2  PHQ- 9 Score - 0 - 6  Exception Documentation - - - Other- indicate reason in comment box    Cognitive Function MMSE - Mini Mental State Exam 10/31/2016  Orientation to time 5  Orientation to Place 5  Registration 3  Attention/ Calculation 5  Recall 2  Language- name 2 objects 2  Language- repeat 1  Language- follow 3 step command 3  Language- read & follow direction 1  Write a sentence 1  Copy design 1  Total score 29        Immunization History  Administered Date(s) Administered  . Influenza, High Dose Seasonal PF 05/17/2014  . Influenza,inj,Quad PF,36+ Mos 04/27/2015, 05/09/2016  . Pneumococcal Conjugate-13 11/17/2014  . Pneumococcal Polysaccharide-23 06/05/2012  . Zoster 04/22/2013   Screening Tests Health Maintenance  Topic Date Due  . TETANUS/TDAP  04/22/2017 (Originally [birth year]/1962)  . FOOT EXAM  01/09/2017  . INFLUENZA VACCINE  02/20/2017  .  OPHTHALMOLOGY EXAM  03/20/2017  . HEMOGLOBIN A1C  04/12/2017  . COLONOSCOPY  08/13/2021  . PNA vac Low Risk Adult  Completed      Plan:     Make appointment with eye doctor.   Bring a copy of your advance directives to your next office visit.  Continue doing brain stimulating activities (puzzles, reading, adult coloring books, staying active) to keep memory sharp.    During the course of the visit the patient was educated and counseled about the following appropriate screening and preventive services:   Vaccines to include Pneumoccal, Influenza, Hepatitis B, Td, Zostavax, HCV  Cardiovascular Disease  Colorectal cancer screening  Diabetes screening  Prostate Cancer Screening  Glaucoma screening  Nutrition counseling   Patient Instructions (the written plan) was given to the patient.    Alysia Penna, RN  10/31/2016  Agree w/ documentation provided above.  Neena Rhymes, MD

## 2016-11-01 DIAGNOSIS — H2513 Age-related nuclear cataract, bilateral: Secondary | ICD-10-CM | POA: Diagnosis not present

## 2016-11-01 DIAGNOSIS — H40033 Anatomical narrow angle, bilateral: Secondary | ICD-10-CM | POA: Diagnosis not present

## 2016-11-13 DIAGNOSIS — H6123 Impacted cerumen, bilateral: Secondary | ICD-10-CM | POA: Diagnosis not present

## 2016-11-14 DIAGNOSIS — H1851 Endothelial corneal dystrophy: Secondary | ICD-10-CM | POA: Diagnosis not present

## 2016-11-14 DIAGNOSIS — H2511 Age-related nuclear cataract, right eye: Secondary | ICD-10-CM | POA: Diagnosis not present

## 2016-11-14 DIAGNOSIS — E119 Type 2 diabetes mellitus without complications: Secondary | ICD-10-CM | POA: Diagnosis not present

## 2016-11-14 DIAGNOSIS — H2512 Age-related nuclear cataract, left eye: Secondary | ICD-10-CM | POA: Diagnosis not present

## 2016-11-14 LAB — HM DIABETES EYE EXAM

## 2016-11-20 ENCOUNTER — Encounter: Payer: Self-pay | Admitting: General Practice

## 2016-11-25 ENCOUNTER — Other Ambulatory Visit: Payer: Self-pay | Admitting: Physician Assistant

## 2016-11-29 ENCOUNTER — Other Ambulatory Visit: Payer: Self-pay | Admitting: General Practice

## 2016-11-29 DIAGNOSIS — Z961 Presence of intraocular lens: Secondary | ICD-10-CM | POA: Diagnosis not present

## 2016-11-29 DIAGNOSIS — H2513 Age-related nuclear cataract, bilateral: Secondary | ICD-10-CM | POA: Diagnosis not present

## 2016-11-29 DIAGNOSIS — H2512 Age-related nuclear cataract, left eye: Secondary | ICD-10-CM | POA: Diagnosis not present

## 2016-11-29 MED ORDER — SITAGLIPTIN PHOSPHATE 100 MG PO TABS: 100.0000 mg | ORAL_TABLET | Freq: Every day | ORAL | 6 refills | Status: DC

## 2016-11-29 MED ORDER — FENOFIBRATE 160 MG PO TABS: 160.0000 mg | ORAL_TABLET | Freq: Every day | ORAL | 6 refills | Status: DC

## 2016-12-01 ENCOUNTER — Other Ambulatory Visit: Payer: Self-pay | Admitting: Family Medicine

## 2016-12-04 DIAGNOSIS — J209 Acute bronchitis, unspecified: Secondary | ICD-10-CM | POA: Diagnosis not present

## 2016-12-21 ENCOUNTER — Other Ambulatory Visit: Payer: Self-pay | Admitting: Family Medicine

## 2016-12-25 ENCOUNTER — Other Ambulatory Visit: Payer: Self-pay | Admitting: Family Medicine

## 2016-12-26 ENCOUNTER — Other Ambulatory Visit: Payer: Self-pay | Admitting: Family Medicine

## 2016-12-27 DIAGNOSIS — H2513 Age-related nuclear cataract, bilateral: Secondary | ICD-10-CM | POA: Diagnosis not present

## 2016-12-27 DIAGNOSIS — Z961 Presence of intraocular lens: Secondary | ICD-10-CM | POA: Diagnosis not present

## 2016-12-27 DIAGNOSIS — H2511 Age-related nuclear cataract, right eye: Secondary | ICD-10-CM | POA: Diagnosis not present

## 2016-12-30 ENCOUNTER — Other Ambulatory Visit: Payer: Self-pay | Admitting: Family Medicine

## 2017-01-11 DIAGNOSIS — H04123 Dry eye syndrome of bilateral lacrimal glands: Secondary | ICD-10-CM | POA: Diagnosis not present

## 2017-01-17 ENCOUNTER — Encounter: Payer: Self-pay | Admitting: Family Medicine

## 2017-01-17 ENCOUNTER — Ambulatory Visit (INDEPENDENT_AMBULATORY_CARE_PROVIDER_SITE_OTHER): Payer: PPO | Admitting: Family Medicine

## 2017-01-17 VITALS — BP 130/78 | HR 61 | Temp 98.0°F | Resp 16 | Ht 68.0 in | Wt 228.1 lb

## 2017-01-17 DIAGNOSIS — E1140 Type 2 diabetes mellitus with diabetic neuropathy, unspecified: Secondary | ICD-10-CM

## 2017-01-17 LAB — HEMOGLOBIN A1C: Hgb A1c MFr Bld: 7.7 % — ABNORMAL HIGH (ref 4.6–6.5)

## 2017-01-17 LAB — BASIC METABOLIC PANEL
BUN: 15 mg/dL (ref 6–23)
CALCIUM: 9.6 mg/dL (ref 8.4–10.5)
CO2: 26 meq/L (ref 19–32)
CREATININE: 1.02 mg/dL (ref 0.40–1.50)
Chloride: 104 mEq/L (ref 96–112)
GFR: 75.98 mL/min (ref >60.00)
GLUCOSE: 189 mg/dL — AB (ref 70–99)
Potassium: 4.4 mEq/L (ref 3.5–5.1)
Sodium: 138 mEq/L (ref 135–145)

## 2017-01-17 NOTE — Progress Notes (Signed)
Pre visit review using our clinic review tool, if applicable. No additional management support is needed unless otherwise documented below in the visit note. 

## 2017-01-17 NOTE — Patient Instructions (Addendum)
Schedule your complete physical in 3-4 months We'll notify you of your lab results and make any changes if needed Keep up the good work on healthy diet and regular exercise- you look great! Call with any questions or concerns Happy 4th of July!!!

## 2017-01-17 NOTE — Assessment & Plan Note (Signed)
Chronic problem.  Pt is asking to increase the Trulicity and stop the Januvia.  Will wait on labs to determine if this change would be appropriate.  UTD on eye exam.  Foot exam done today.  On ACE for renal protection.  Check labs.  Adjust meds prn

## 2017-01-17 NOTE — Progress Notes (Signed)
   Subjective:    Patient ID: Steve CanningClarence M Kye, male    DOB: Dec 10, 1940, 76 y.o.   MRN: [id number]  HPI DM- chronic problem, on Glimepiride 4mg , Metformin 500mg  BID, Januvia 100mg  daily, Trulicity 0.75mg  weekly.  UTD on eye exam.  Due for foot exam.  On ACE for renal protection.  Pt reports sugars have been coming down, 'a little over 200 most of the time'.  Pt got 'terribly sick' w/ initiation of Trulicity.  Pt is asking to stop Januvia in favor of higher Trulicity dose.  Denies symptomatic lows.  No CP, SOB, HAs, visual changes, edema.  Baseline tingling of feet- particularly at night.   Review of Systems For ROS see HPI     Objective:   Physical Exam  Constitutional: He is oriented to person, place, and time. He appears well-developed and well-nourished. No distress.  HENT:  Head: Normocephalic and atraumatic.  Eyes: Conjunctivae and EOM are normal. Pupils are equal, round, and reactive to light.  Neck: Normal range of motion. Neck supple. No thyromegaly present.  Cardiovascular: Normal rate, regular rhythm, normal heart sounds and intact distal pulses.   No murmur heard. Pulmonary/Chest: Effort normal and breath sounds normal. No respiratory distress.  Abdominal: Soft. Bowel sounds are normal. He exhibits no distension.  Musculoskeletal: He exhibits no edema.  Lymphadenopathy:    He has no cervical adenopathy.  Neurological: He is alert and oriented to person, place, and time. No cranial nerve deficit.  Skin: Skin is warm and dry.  Psychiatric: He has a normal mood and affect. His behavior is normal.  Vitals reviewed.  Diabetic Foot Exam - Simple   Simple Foot Form Diabetic Foot exam was performed with the following findings:  Yes 01/17/2017  8:51 AM  Visual Inspection No deformities, no ulcerations, no other skin breakdown bilaterally:  Yes Sensation Testing Intact to touch and monofilament testing bilaterally:  Yes Pulse Check Posterior Tibialis and Dorsalis pulse intact  bilaterally:  Yes Comments            Assessment & Plan:

## 2017-01-18 ENCOUNTER — Other Ambulatory Visit: Payer: Self-pay | Admitting: General Practice

## 2017-01-18 MED ORDER — DULAGLUTIDE 1.5 MG/0.5ML ~~LOC~~ SOAJ: 1.5000 mg | SUBCUTANEOUS | 6 refills | Status: DC

## 2017-01-28 DIAGNOSIS — H1851 Endothelial corneal dystrophy: Secondary | ICD-10-CM | POA: Diagnosis not present

## 2017-02-20 ENCOUNTER — Encounter: Payer: Self-pay | Admitting: Physician Assistant

## 2017-02-20 ENCOUNTER — Ambulatory Visit (INDEPENDENT_AMBULATORY_CARE_PROVIDER_SITE_OTHER): Payer: PPO | Admitting: Physician Assistant

## 2017-02-20 VITALS — BP 124/62 | HR 64 | Ht 67.0 in | Wt 232.8 lb

## 2017-02-20 DIAGNOSIS — E119 Type 2 diabetes mellitus without complications: Secondary | ICD-10-CM

## 2017-02-20 DIAGNOSIS — I10 Essential (primary) hypertension: Secondary | ICD-10-CM | POA: Diagnosis not present

## 2017-02-20 DIAGNOSIS — E782 Mixed hyperlipidemia: Secondary | ICD-10-CM | POA: Diagnosis not present

## 2017-02-20 DIAGNOSIS — I2510 Atherosclerotic heart disease of native coronary artery without angina pectoris: Secondary | ICD-10-CM

## 2017-02-20 NOTE — Progress Notes (Signed)
Cardiology Office Note    Date:  02/20/2017   ID:  Steve CanningClarence M Twichell, DOB 1940-10-25, MRN [id number]  PCP:  Sheliah Hatchabori, Katherine E, MD  Cardiologist: Dr. Excell Seltzerooper  Chief Complaint: 6 months follow up for CAD  History of Present Illness:   Steve Wiggins is a 76 y.o. male with hx of CAD, HTN, HLD and DM presents for follow up.   He had a stress test in 07/2010 for dyspnea on exertion (states he never had chest pain) which was abnormal. It was followed by a diagnostic cath (08/2010) which resulted in PCI to OM. He also had moderately tight stenosis of a diagonal branch, for which medical management was recommended. [facility] [place].  hx of bradycardia on higher dose of coreg.  Last Myoview 05/2016 low risk. Last echo 07/2016 showed normal LV function of 55%, no WM abnormality.   Here for follow-up. Blood sugar has been improved on Trulicity. He has stable dyspnea on exertion, mostly while walking uphill. No exacerbating symptoms. Denies chest pain, palpitation, lower extremity edema, orthopnea, PND, syncope, melena or blood in his stool or urine. Recently tripped over and injured her right leg. This is healing well.    Past Medical History:  Diagnosis Date  . Coronary artery disease    a. 07/2010 echo: nl EF;  b. 08/2010 Cath/PCI: Resolute DES to OM  . Diabetes mellitus    type 2  . GERD (gastroesophageal reflux disease)   . History of gastric ulcer   . History of gout   . History of head injury FELL OFF LADDER,  1999--  S/P DRAINAGE SUBDERMAL HEMATOMA--  NO RESIDUAL  . Hyperlipidemia   . Hypertension   . Mild carotid artery disease (HCC)    a. 07/2010 Carotid U/S: bilat ICA 0-35%   . Obesity   . Penile lesion   . Umbilical hernia     Past Surgical History:  Procedure Laterality Date  . CARDIOVASCULAR STRESS TEST  08-28-2010  [place]   EF 66%/ SMALL AREA PROXIMAL POSTEROLATERAL ISCHEMIA  . cataract surgery    . CHOLECYSTECTOMY  06/05/2012   Procedure:  LAPAROSCOPIC CHOLECYSTECTOMY;  Surgeon: Emelia LoronMatthew Wakefield, MD;  [facility];  Service: General;  Laterality: N/A;  . CORONARY ANGIOPLASTY WITH STENT PLACEMENT  09-26-2011  ([place], [place])   DRUG-ELUTING STENT X1 TO FIRST OBTUSE MARGINAL BRANCH CIRCUMFLEX/ MODERATE LESION DIAGINAL BRANCH TREATED MEDICALLY  . SURG FOR DRAINAGE SUBDURAL  HEMATOMA  1999  . TRANSTHORACIC ECHOCARDIOGRAM  08-21-2010   LVF NORMAL/ MILD MITRAL AND TRICUSID REGURG.  . UMBILICAL HERNIA REPAIR  06/05/2012   Procedure: HERNIA REPAIR UMBILICAL ADULT;  Surgeon: Emelia LoronMatthew Wakefield, MD;  [facility];  Service: General;  Laterality: N/A;  Primary Umbilical Hernia Repair    Current Medications: Prior to Admission medications   Medication Sig Start Date End Date Taking? Authorizing Provider  acetaminophen (TYLENOL) 325 MG tablet Take 650 mg by mouth every 6 (six) hours as needed (pain).    [provider]  amLODipine (NORVASC) 10 MG tablet Take 1 tablet (10 mg total) by mouth every morning. 06/22/16   Manson PasseyBhagat, Ayn Domangue, PA  aspirin 81 MG tablet Take 81 mg by mouth every morning.     [provider]  carvedilol (COREG) 12.5 MG tablet Take 1 tablet (12.5 mg total) by mouth 2 (two) times daily with a meal. 11/26/16   Tonny Bollmanooper, Michael, MD  Dulaglutide (TRULICITY) 1.5 MG/0.5ML SOPN Inject 1.5 mg into the skin once  a week. 01/18/17   Sheliah Hatchabori, Katherine E, MD  fenofibrate 160 MG tablet Take 1 tablet (160 mg total) by mouth daily. 11/29/16   Sheliah Hatchabori, Katherine E, MD  fluticasone (FLONASE) 50 MCG/ACT nasal spray Place 2 sprays into both nostrils daily. 10/31/16   Sheliah Hatchabori, Katherine E, MD  glimepiride (AMARYL) 4 MG tablet TAKE 1 TABLET (4 MG TOTAL) BY MOUTH DAILY BEFORE BREAKFAST. 12/03/16   Sheliah Hatchabori, Katherine E, MD  hydrALAZINE (APRESOLINE) 50 MG tablet Take 1 tablet (50 mg total) by mouth 2 (two) times daily. 09/10/16   Tonny Bollmanooper, Michael, MD  hydrochlorothiazide (HYDRODIURIL) 25 MG tablet Take 1 tablet (25 mg total) by mouth  daily. 06/04/16   Lyrik Buresh, Sharrell KuBhavinkumar, PA  Lancets (ONETOUCH ULTRASOFT) lancets PT TESTS SUGARS TWICE DAILY.DX. E11.40 12/31/16   Sheliah Hatchabori, Katherine E, MD  levothyroxine (SYNTHROID, LEVOTHROID) 50 MCG tablet TAKE 1 TABLET (50 MCG TOTAL) BY MOUTH DAILY. 03/05/16   Sheliah Hatchabori, Katherine E, MD  lisinopril (PRINIVIL,ZESTRIL) 40 MG tablet Take 40 mg by mouth daily. 08/29/16   [provider]  metFORMIN (GLUCOPHAGE) 500 MG tablet Take by mouth 2 (two) times daily with a meal.    [provider]  omeprazole (PRILOSEC) 40 MG capsule TAKE 1 CAPSULE BY MOUTH EVERY EVENING. 12/26/16   Sheliah Hatchabori, Katherine E, MD  ONE TOUCH ULTRA TEST test strip USE TO CHECK SUGARS TWICE DAILY. DX E11.40 12/25/16   Sheliah Hatchabori, Katherine E, MD  rosuvastatin (CRESTOR) 10 MG tablet Take 1 tablet (10 mg total) by mouth daily. 06/04/16   Manson PasseyBhagat, Davione Lenker, PA  tamsulosin (FLOMAX) 0.4 MG CAPS capsule Take 0.8 mg by mouth daily.  03/11/14   [provider]  traZODone (DESYREL) 50 MG tablet Take 0.5-1 tablets (25-50 mg total) by mouth at bedtime as needed for sleep. 12/14/15   Sheliah Hatchabori, Katherine E, MD    Allergies:   Patient has no known allergies.   Social History   Social History  . Marital status: Married    Spouse name: N/A  . Number of children: N/A  . Years of education: N/A   Social History Main Topics  . Smoking status: Former Games developermoker  . Smokeless tobacco: Never Used     Comment: quit over 50 yrs ago.  . Alcohol use No     Comment: previously had an occasional drink but nothing in 50 yrs.  . Drug use: No  . Sexual activity: Not Asked   Other Topics Concern  . None   Social History Narrative   Lives in [place] with his wife.     Family History:  The patient's family history includes Heart attack in his maternal grandmother and mother; Stroke in his mother.   ROS:   Please see the history of present illness.    ROS All other systems reviewed and are negative.   PHYSICAL EXAM:   VS:  BP 124/62   Pulse  64   Ht 5\' 7"  (1.702 m)   Wt 232 lb 12.8 oz (105.6 kg)   SpO2 96%   BMI 36.46 kg/m    GEN: Well nourished, well developed, in no acute distress  HEENT: normal  Neck: no JVD, carotid bruits, or masses Cardiac: RRR; no murmurs, rubs, or gallops trace BL LE edema  Respiratory:  clear to auscultation bilaterally, normal work of breathing GI: soft, nontender, nondistended, + BS MS: no deformity or atrophy  Skin: warm and dry, R leg healing well with mild erythema  Neuro:  Alert and Oriented x 3, Strength and sensation  are intact Psych: euthymic mood, full affect  Wt Readings from Last 3 Encounters:  02/20/17 232 lb 12.8 oz (105.6 kg)  01/17/17 228 lb 2 oz (103.5 kg)  10/31/16 229 lb 8 oz (104.1 kg)      Studies/Labs Reviewed:   EKG:  EKG is not  ordered today.   Recent Labs: 10/10/2016: ALT 20; Hemoglobin 13.4; Platelets 167.0; TSH 3.38 01/17/2017: BUN 15; Creatinine, Ser 1.02; Potassium 4.4; Sodium 138   Lipid Panel    Component Value Date/Time   CHOL 107 10/10/2016 1034   TRIG 207.0 (H) 10/10/2016 1034   HDL 27.40 (L) 10/10/2016 1034   CHOLHDL 4 10/10/2016 1034   VLDL 41.4 (H) 10/10/2016 1034   LDLCALC 37 07/01/2014 0904   LDLDIRECT 56.0 10/10/2016 1034    Additional studies/ records that were reviewed today include:  Myoview 05/2016  Nuclear stress EF: 53%.  There was no ST segment deviation noted during stress.  Defect 1: There is a small defect of moderate severity present in the apex location.  This is a low risk study.  The left ventricular ejection fraction is normal (55-65%).   Low risk stress nuclear study with mild apical ischemia; LV function mildly reduced to low normal (EF 53); mild global hypokinesis.    ASSESSMENT & PLAN:    1. CAD s/p DES to OM in 2012 - No chest pain. Stable dyspnea on exertion. Continue exercise regimen. - Continue medical therapy with aspirin, Coreg, lisinopril, statin.   2. HLD - 10/10/2016: Cholesterol 107; HDL 27.40;  Triglycerides 207.0; VLDL 41.4  - Followed by PCP. Continue Crestor.   3. HLD - Stable and well controlled on current regimen.   4. DM - per PCP    Medication Adjustments/Labs and Tests Ordered: Current medicines are reviewed at length with the patient today.  Concerns regarding medicines are outlined above.  Medication changes, Labs and Tests ordered today are listed in the Patient Instructions below. Patient Instructions  Your physician recommends that you continue on your current medications as directed. Please refer to the Current Medication list given to you today.  Your physician wants you to follow-up in: 6 months with Dr. Excell Seltzer.  You will receive a reminder letter in the mail two months in advance. If you don't receive a letter, please call our office to schedule the follow-up appointment.     Lorelei Pont, [place]  02/20/2017 8:13 AM    [facility] [address] [place], [place], [place]  [address] Phone: [phone]; Fax: [phone]

## 2017-02-20 NOTE — Patient Instructions (Signed)
Your physician recommends that you continue on your current medications as directed. Please refer to the Current Medication list given to you today.  Your physician wants you to follow-up in: 6 months with Dr. Cooper.  You will receive a reminder letter in the mail two months in advance. If you don't receive a letter, please call our office to schedule the follow-up appointment.  

## 2017-02-21 ENCOUNTER — Other Ambulatory Visit: Payer: Self-pay | Admitting: Family Medicine

## 2017-02-28 ENCOUNTER — Other Ambulatory Visit: Payer: Self-pay | Admitting: Family Medicine

## 2017-02-28 DIAGNOSIS — E038 Other specified hypothyroidism: Secondary | ICD-10-CM

## 2017-03-23 DIAGNOSIS — J039 Acute tonsillitis, unspecified: Secondary | ICD-10-CM | POA: Diagnosis not present

## 2017-04-08 ENCOUNTER — Other Ambulatory Visit: Payer: Self-pay | Admitting: General Practice

## 2017-04-08 MED ORDER — DULAGLUTIDE 1.5 MG/0.5ML ~~LOC~~ SOAJ: 1.5000 mg | SUBCUTANEOUS | 1 refills | Status: DC

## 2017-05-15 ENCOUNTER — Other Ambulatory Visit: Payer: Self-pay | Admitting: General Practice

## 2017-05-15 MED ORDER — METFORMIN HCL 1000 MG PO TABS: ORAL_TABLET | ORAL | 1 refills | Status: DC

## 2017-05-17 ENCOUNTER — Other Ambulatory Visit: Payer: Self-pay

## 2017-05-17 MED ORDER — LISINOPRIL 40 MG PO TABS
40.0000 mg | ORAL_TABLET | Freq: Every day | ORAL | 2 refills | Status: DC
Start: 2017-05-17 — End: 2017-07-18

## 2017-05-17 MED ORDER — HYDRALAZINE HCL 50 MG PO TABS: 50.0000 mg | ORAL_TABLET | Freq: Two times a day (BID) | ORAL | 2 refills | Status: DC

## 2017-05-21 ENCOUNTER — Ambulatory Visit (INDEPENDENT_AMBULATORY_CARE_PROVIDER_SITE_OTHER): Payer: PPO | Admitting: Family Medicine

## 2017-05-21 ENCOUNTER — Encounter: Payer: Self-pay | Admitting: Family Medicine

## 2017-05-21 VITALS — BP 130/80 | HR 78 | Temp 98.1°F | Resp 16 | Ht 67.0 in | Wt 225.2 lb

## 2017-05-21 DIAGNOSIS — Z0000 Encounter for general adult medical examination without abnormal findings: Secondary | ICD-10-CM

## 2017-05-21 DIAGNOSIS — Z125 Encounter for screening for malignant neoplasm of prostate: Secondary | ICD-10-CM | POA: Diagnosis not present

## 2017-05-21 DIAGNOSIS — E1140 Type 2 diabetes mellitus with diabetic neuropathy, unspecified: Secondary | ICD-10-CM | POA: Diagnosis not present

## 2017-05-21 LAB — BASIC METABOLIC PANEL
BUN: 21 mg/dL (ref 6–23)
CO2: 26 mEq/L (ref 19–32)
Calcium: 9.8 mg/dL (ref 8.4–10.5)
Chloride: 103 mEq/L (ref 96–112)
Creatinine, Ser: 1.03 mg/dL (ref 0.40–1.50)
GFR: 75.06 mL/min (ref >60.00)
GLUCOSE: 207 mg/dL — AB (ref 70–99)
POTASSIUM: 4.1 meq/L (ref 3.5–5.1)
SODIUM: 137 meq/L (ref 135–145)

## 2017-05-21 LAB — CBC WITH DIFFERENTIAL/PLATELET
BASOS PCT: 0.9 % (ref 0.0–3.0)
Basophils Absolute: 0.1 10*3/uL (ref 0.0–0.1)
EOS PCT: 2.4 % (ref 0.0–5.0)
Eosinophils Absolute: 0.2 10*3/uL (ref 0.0–0.7)
HCT: 43.1 % (ref 39.0–52.0)
Hemoglobin: 14.5 g/dL (ref 13.0–17.0)
LYMPHS ABS: 1.7 10*3/uL (ref 0.7–4.0)
Lymphocytes Relative: 22.9 % (ref 12.0–46.0)
MCHC: 33.7 g/dL (ref 30.0–36.0)
MCV: 93.2 fl (ref 78.0–100.0)
MONOS PCT: 8.9 % (ref 3.0–12.0)
Monocytes Absolute: 0.7 10*3/uL (ref 0.1–1.0)
NEUTROS ABS: 4.8 10*3/uL (ref 1.4–7.7)
NEUTROS PCT: 64.9 % (ref 43.0–77.0)
PLATELETS: 152 10*3/uL (ref 150.0–400.0)
RBC: 4.62 Mil/uL (ref 4.22–5.81)
RDW: 13.7 % (ref 11.5–15.5)
WBC: 7.4 10*3/uL (ref 4.0–10.5)

## 2017-05-21 LAB — LIPID PANEL
CHOL/HDL RATIO: 4
Cholesterol: 109 mg/dL (ref 0–200)
HDL: 28.1 mg/dL — ABNORMAL LOW (ref >39.00)
NONHDL: 81.1
TRIGLYCERIDES: 239 mg/dL — AB (ref 0.0–149.0)
VLDL: 47.8 mg/dL — AB (ref 0.0–40.0)

## 2017-05-21 LAB — HEPATIC FUNCTION PANEL
ALK PHOS: 41 U/L (ref 39–117)
ALT: 26 U/L (ref 0–53)
AST: 25 U/L (ref 0–37)
Albumin: 4.4 g/dL (ref 3.5–5.2)
BILIRUBIN DIRECT: 0.1 mg/dL (ref 0.0–0.3)
TOTAL PROTEIN: 6.9 g/dL (ref 6.0–8.3)
Total Bilirubin: 0.6 mg/dL (ref 0.2–1.2)

## 2017-05-21 LAB — PSA, MEDICARE: PSA: 2.15 ng/ml (ref 0.10–4.00)

## 2017-05-21 LAB — TSH: TSH: 3.55 u[IU]/mL (ref 0.35–4.50)

## 2017-05-21 LAB — LDL CHOLESTEROL, DIRECT: Direct LDL: 58 mg/dL

## 2017-05-21 LAB — HEMOGLOBIN A1C: Hgb A1c MFr Bld: 7.6 % — ABNORMAL HIGH (ref 4.6–6.5)

## 2017-05-21 NOTE — Patient Instructions (Addendum)
Follow up in 3-4 months to recheck diabetes We'll notify you of your lab results and make any changes if needed Continue to work on healthy diet and regular exercise- you look great! You are up to date on colonoscopy (yay!), immunizations (yay!), and eye exam (yay!) You do have quite a bit of drainage down the back of your throat- please start daily Claritin or Zyrtec (OTC store brand generic works just as well) Call with any questions or concerns Happy Fall!!!

## 2017-05-21 NOTE — Progress Notes (Signed)
   Subjective:    Patient ID: Steve CanningClarence M Zaremba, male    DOB: 07-Mar-1941, 76 y.o.   MRN: [id number]  HPI CPE- UTD on colonoscopy, eye exam, foot exam.  On ACE for renal protection.  UTD on immunizations.  Pt has lost 8 lbs since last visit.  Home CBGs are running 181-331 with the bulk of his sugars in the 200s.   Review of Systems Patient reports no vision/hearing changes, anorexia, fever ,adenopathy, persistant/recurrent hoarseness, swallowing issues, chest pain, palpitations, edema, persistant/recurrent cough, hemoptysis, dyspnea (rest,exertional, paroxysmal nocturnal), gastrointestinal  bleeding (melena, rectal bleeding), abdominal pain, excessive heart burn, GU symptoms (dysuria, hematuria, voiding/incontinence issues) syncope, focal weakness, memory loss, skin/hair/nail changes, depression, anxiety, abnormal bruising/bleeding, musculoskeletal symptoms/signs.     Objective:   Physical Exam General Appearance:    Alert, cooperative, no distress, appears stated age  Head:    Normocephalic, without obvious abnormality, atraumatic  Eyes:    PERRL, conjunctiva/corneas clear, EOM's intact, fundi    benign, both eyes       Ears:    Normal TM's and external ear canals, both ears  Nose:   Nares normal, septum midline, mucosa normal, no drainage   or sinus tenderness  Throat:   Lips, mucosa, and tongue normal; teeth and gums normal  Neck:   Supple, symmetrical, trachea midline, no adenopathy;       thyroid:  No enlargement/tenderness/nodules  Back:     Symmetric, no curvature, ROM normal, no CVA tenderness  Lungs:     Clear to auscultation bilaterally, respirations unlabored  Chest wall:    No tenderness or deformity  Heart:    Regular rate and rhythm, S1 and S2 normal, no murmur, rub   or gallop  Abdomen:     Soft, non-tender, bowel sounds active all four quadrants,    no masses, no organomegaly, obese  Genitalia:    Deferred at pt request  Rectal:    Extremities:   Extremities normal,  atraumatic, no cyanosis or edema  Pulses:   2+ and symmetric all extremities  Skin:   Skin color, texture, turgor normal, no rashes or lesions  Lymph nodes:   Cervical, supraclavicular, and axillary nodes normal  Neurologic:   CNII-XII intact. Normal strength, sensation and reflexes      throughout          Assessment & Plan:

## 2017-05-21 NOTE — Assessment & Plan Note (Signed)
Pt's PE unchanged from previous and WNL w/ exception of obesity.  UTD on colonoscopy, immunizations.  Check labs.  Anticipatory guidance provided.  

## 2017-05-21 NOTE — Assessment & Plan Note (Signed)
Chronic problem.  Pt was previously doing very well on Trulicity, Metformin, and Amaryl but his most recent sugars are again high- which is very frustrating to him as he has not changed his diet habits.  UTD on eye exam, foot exam, and is on ACE for renal protection.  Check labs.  Adjust meds prn

## 2017-05-22 ENCOUNTER — Encounter: Payer: Self-pay | Admitting: General Practice

## 2017-05-27 ENCOUNTER — Other Ambulatory Visit: Payer: Self-pay | Admitting: Physician Assistant

## 2017-05-31 ENCOUNTER — Other Ambulatory Visit: Payer: Self-pay | Admitting: General Practice

## 2017-05-31 MED ORDER — ONETOUCH ULTRASOFT LANCETS MISC: 2 refills | Status: DC

## 2017-06-08 ENCOUNTER — Other Ambulatory Visit: Payer: Self-pay | Admitting: Cardiovascular Disease

## 2017-06-10 ENCOUNTER — Other Ambulatory Visit: Payer: Self-pay | Admitting: General Practice

## 2017-06-10 MED ORDER — FENOFIBRATE 160 MG PO TABS: 160.0000 mg | ORAL_TABLET | Freq: Every day | ORAL | 1 refills | Status: DC

## 2017-06-10 NOTE — Telephone Encounter (Signed)
Medication Detail    Disp Refills Start End   carvedilol (COREG) 12.5 MG tablet 180 tablet 3 11/26/2016    Sig - Route: Take 1 tablet (12.5 mg total) by mouth 2 (two) times daily with a meal. - Oral   Sent to pharmacy as: carvedilol (COREG) 12.5 MG tablet   E-Prescribing Status: Receipt confirmed by pharmacy (11/26/2016 4:54 PM EDT)   Pharmacy   [id number] - [place], [place] - [address]

## 2017-06-14 ENCOUNTER — Other Ambulatory Visit: Payer: Self-pay | Admitting: Family Medicine

## 2017-06-17 ENCOUNTER — Other Ambulatory Visit: Payer: Self-pay | Admitting: Family Medicine

## 2017-07-09 ENCOUNTER — Other Ambulatory Visit: Payer: Self-pay | Admitting: Physician Assistant

## 2017-07-18 ENCOUNTER — Other Ambulatory Visit: Payer: Self-pay | Admitting: Physician Assistant

## 2017-08-25 ENCOUNTER — Other Ambulatory Visit: Payer: Self-pay | Admitting: Family Medicine

## 2017-08-25 DIAGNOSIS — E038 Other specified hypothyroidism: Secondary | ICD-10-CM

## 2017-08-27 ENCOUNTER — Other Ambulatory Visit: Payer: Self-pay | Admitting: Physician Assistant

## 2017-09-09 ENCOUNTER — Ambulatory Visit (INDEPENDENT_AMBULATORY_CARE_PROVIDER_SITE_OTHER): Payer: PPO | Admitting: Family Medicine

## 2017-09-09 ENCOUNTER — Other Ambulatory Visit: Payer: Self-pay

## 2017-09-09 ENCOUNTER — Encounter: Payer: Self-pay | Admitting: Family Medicine

## 2017-09-09 VITALS — BP 120/80 | HR 61 | Temp 98.1°F | Resp 16 | Ht 67.0 in | Wt 226.4 lb

## 2017-09-09 DIAGNOSIS — E1140 Type 2 diabetes mellitus with diabetic neuropathy, unspecified: Secondary | ICD-10-CM

## 2017-09-09 LAB — BASIC METABOLIC PANEL
BUN: 18 mg/dL (ref 6–23)
CALCIUM: 9.4 mg/dL (ref 8.4–10.5)
CO2: 26 meq/L (ref 19–32)
Chloride: 101 mEq/L (ref 96–112)
Creatinine, Ser: 1.03 mg/dL (ref 0.40–1.50)
GFR: 75 mL/min (ref >60.00)
GLUCOSE: 256 mg/dL — AB (ref 70–99)
POTASSIUM: 4.4 meq/L (ref 3.5–5.1)
SODIUM: 136 meq/L (ref 135–145)

## 2017-09-09 LAB — HEMOGLOBIN A1C: Hgb A1c MFr Bld: 8.8 % — ABNORMAL HIGH (ref 4.6–6.5)

## 2017-09-09 NOTE — Patient Instructions (Addendum)
Follow up in 3-4 months to recheck sugar and cholesterol and schedule your Medicare Wellness Visit with Steve Wiggins We'll notify you of your lab results and make any changes if needed Continue to work on healthy diet and exercise as able You are due for your eye exam in late April/early May Call with any questions or concerns Happy Spring!!!

## 2017-09-09 NOTE — Progress Notes (Signed)
   Subjective:    Patient ID: Steve CanningClarence M Carothers, male    DOB: [birth year], 77 y.o.   MRN: [id number]  HPI DM- chronic problem, on Metformin 1000mg  BID, Glimepiride 4mg  daily, Trulicity 1.5mg  weekly.  UTD on eye exam, foot exam, and on ACE for renal protection.  Home CBGs are elevated- ranging 200 to 400.  No CP, SOB, HAs, visual changes, abd pain.  No numbness or tingling of hands, mild numbness of feet- particularly R foot.  Sxs improve w/ 'diabetic lotion'.     Review of Systems For ROS see HPI     Objective:   Physical Exam  Constitutional: He is oriented to person, place, and time. He appears well-developed and well-nourished. No distress.  obese  HENT:  Head: Normocephalic and atraumatic.  Eyes: Conjunctivae and EOM are normal. Pupils are equal, round, and reactive to light.  Neck: Normal range of motion. Neck supple. No thyromegaly present.  Cardiovascular: Normal rate, regular rhythm, normal heart sounds and intact distal pulses.  No murmur heard. Pulmonary/Chest: Effort normal and breath sounds normal. No respiratory distress.  Abdominal: Soft. Bowel sounds are normal. He exhibits no distension.  Musculoskeletal: He exhibits no edema.  Lymphadenopathy:    He has no cervical adenopathy.  Neurological: He is alert and oriented to person, place, and time. No cranial nerve deficit.  Skin: Skin is warm and dry.  Psychiatric: He has a normal mood and affect. His behavior is normal.  Vitals reviewed.         Assessment & Plan:

## 2017-09-09 NOTE — Assessment & Plan Note (Signed)
Chronic problem.  Pt has hx of adequate control but his home CBGs are quite high.  Asymptomatic at this time.  UTD on eye exam, foot exam.  On ACE for renal protection.  Check labs.  Adjust meds prn

## 2017-09-10 ENCOUNTER — Encounter: Payer: Self-pay | Admitting: General Practice

## 2017-09-16 ENCOUNTER — Other Ambulatory Visit: Payer: Self-pay | Admitting: General Practice

## 2017-09-16 MED ORDER — FLUTICASONE PROPIONATE 50 MCG/ACT NA SUSP: 2.0000 | Freq: Every day | NASAL | 6 refills | Status: AC

## 2017-09-27 ENCOUNTER — Other Ambulatory Visit: Payer: Self-pay | Admitting: General Practice

## 2017-09-27 MED ORDER — DULAGLUTIDE 1.5 MG/0.5ML ~~LOC~~ SOAJ: 1.5000 mg | SUBCUTANEOUS | 1 refills | Status: DC

## 2017-10-17 ENCOUNTER — Other Ambulatory Visit: Payer: Self-pay | Admitting: Cardiovascular Disease

## 2017-10-23 DIAGNOSIS — R350 Frequency of micturition: Secondary | ICD-10-CM | POA: Diagnosis not present

## 2017-10-23 DIAGNOSIS — N401 Enlarged prostate with lower urinary tract symptoms: Secondary | ICD-10-CM | POA: Diagnosis not present

## 2017-11-01 ENCOUNTER — Other Ambulatory Visit: Payer: Self-pay | Admitting: Family Medicine

## 2017-11-01 DIAGNOSIS — H26493 Other secondary cataract, bilateral: Secondary | ICD-10-CM | POA: Diagnosis not present

## 2017-11-20 ENCOUNTER — Other Ambulatory Visit: Payer: Self-pay | Admitting: Cardiovascular Disease

## 2017-12-01 IMAGING — CT CT HEAD W/O CM
3 of 4 series · 14 of 47 positions shown, 16 images · non-contrast
Comparison: Report dated 04/29/1998

CLINICAL DATA: Dizziness.  Fell and hit his head 3 days ago.

EXAM:
CT HEAD WITHOUT CONTRAST
TECHNIQUE: Contiguous axial images were obtained from the base of the skull
through the vertex without intravenous contrast.

[Series 2: head w/o · axial · non-contrast · 0.45mm/px · z∈[+568,+698]mm · 8 of 32 slices shown, 10 images]
[im 3/32  brain]
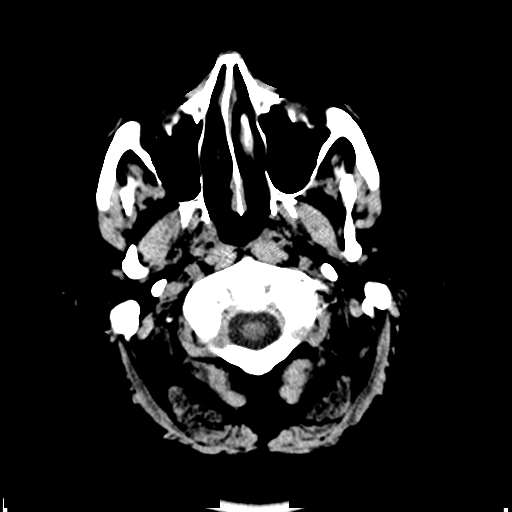
[im 3/32  bone]
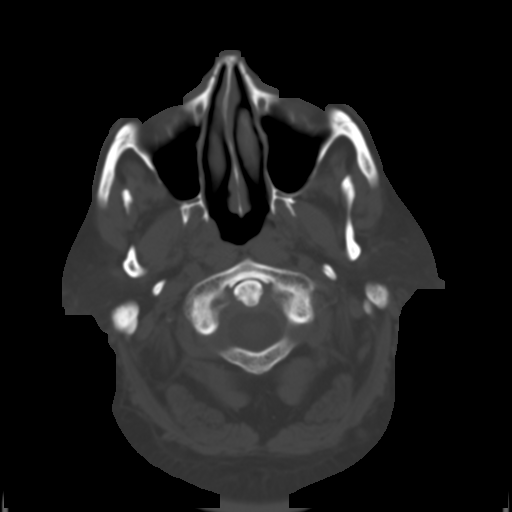
[im 7/32  brain]
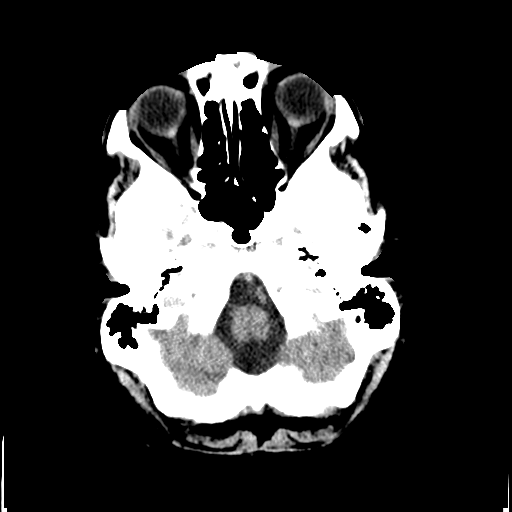
[im 12/32  brain]
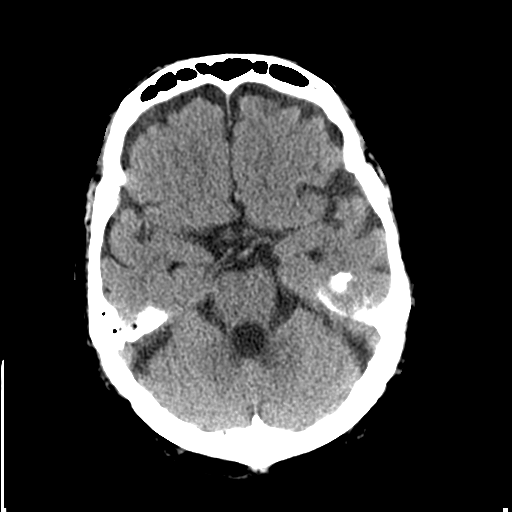
[im 14/32  brain]
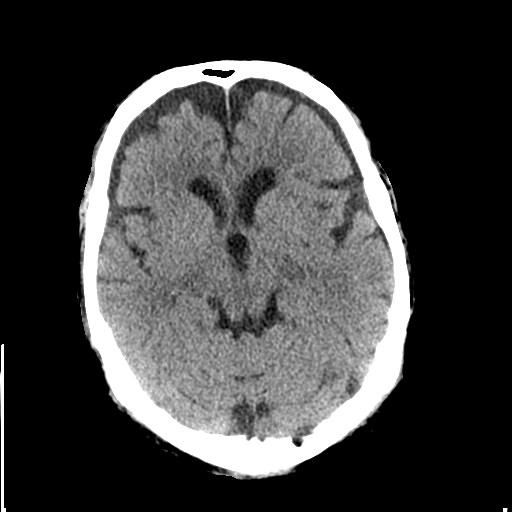
[im 18/32  brain]
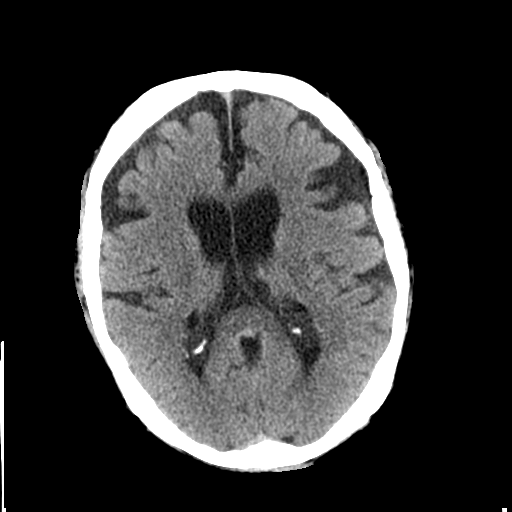
[im 18/32  bone]
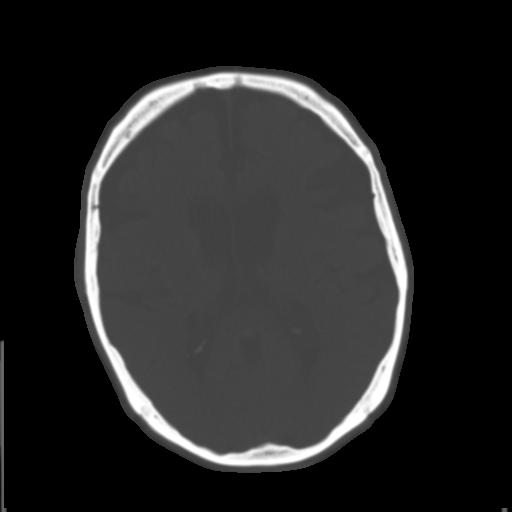
[im 20/32  brain]
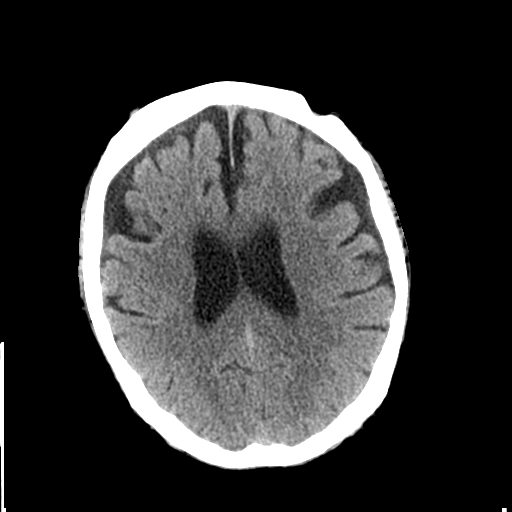
[im 25/32  brain]
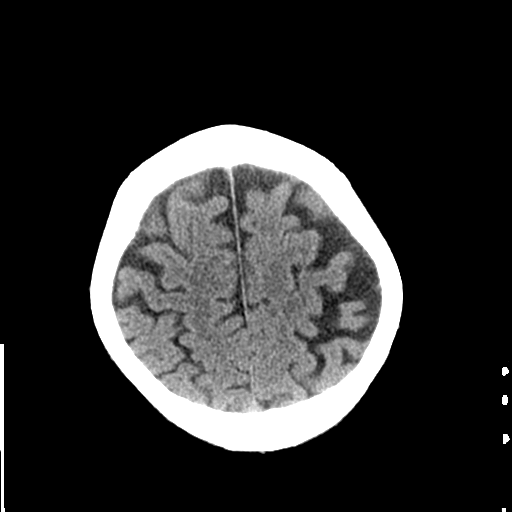
[im 29/32  brain]
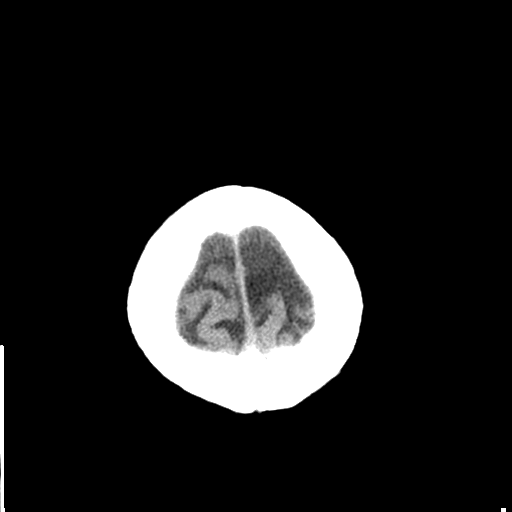

[Series 4: coronal · coronal · 0.31mm/px · 3 of 77 slices shown]
[im 26/77  brain]
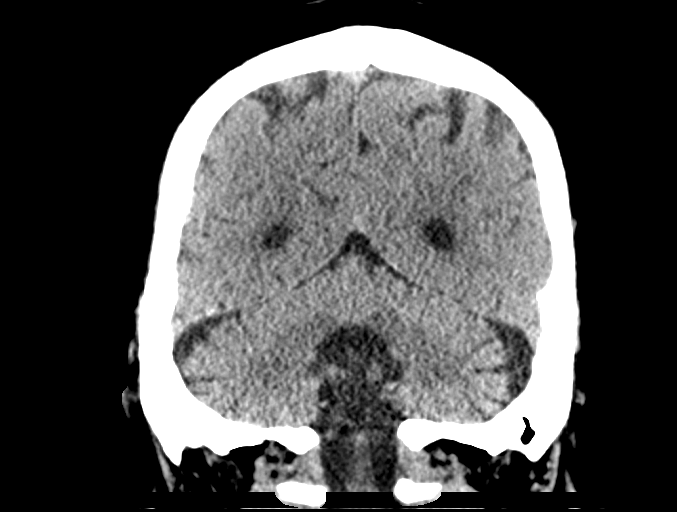
[im 34/77  brain]
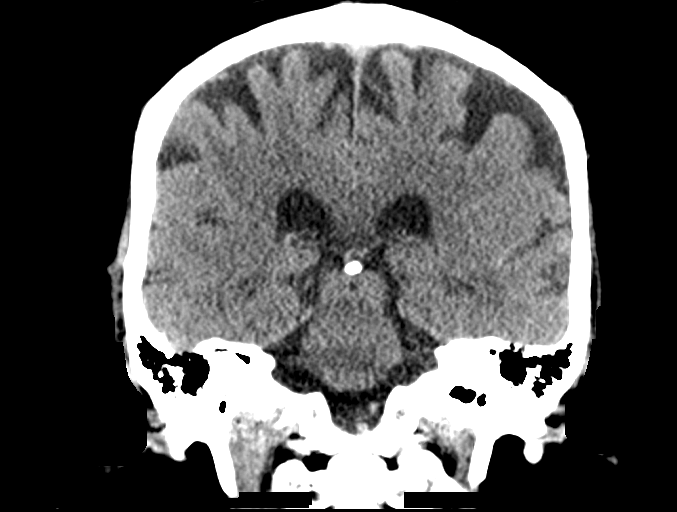
[im 43/77  brain]
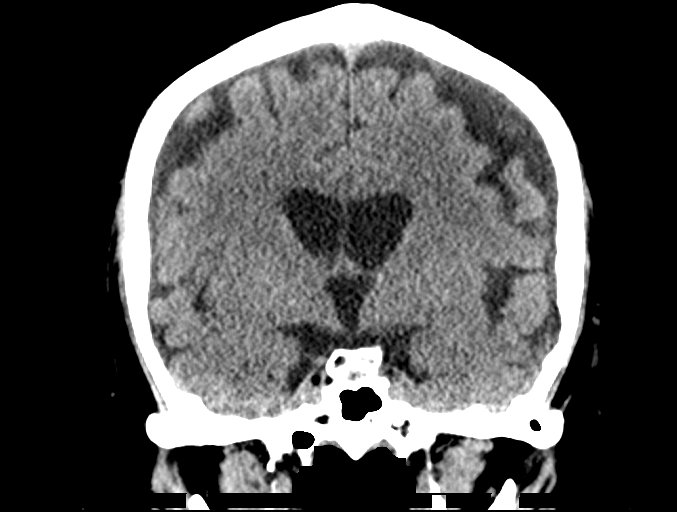

[Series 5: sagittal · sagittal · 0.31mm/px · 3 of 64 slices shown]
[im 22/64  brain]
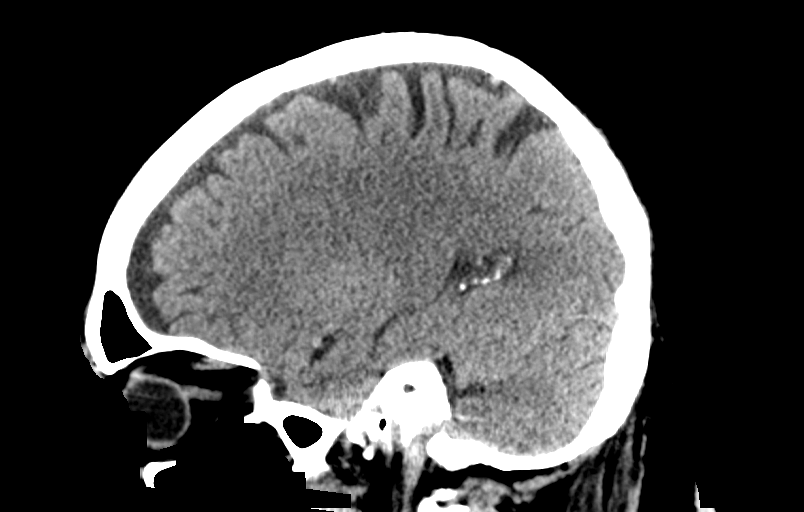
[im 32/64  brain]
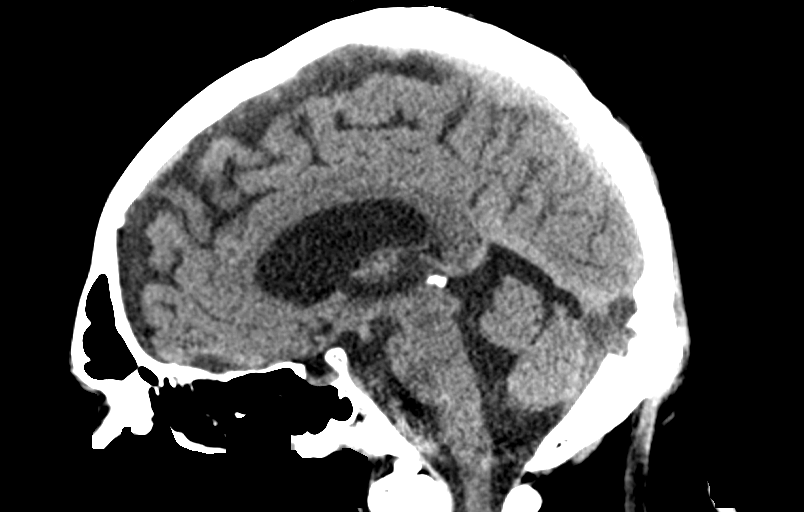
[im 43/64  brain]
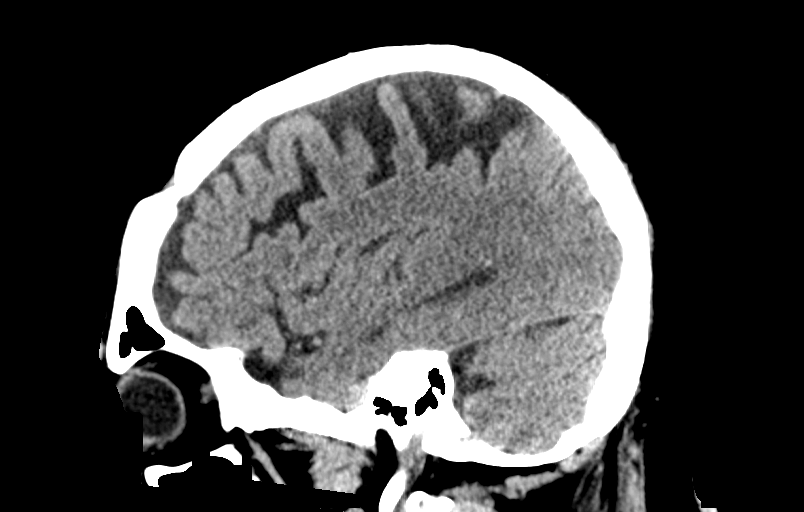

[14 of 47 positions shown; findings below may reference images not displayed]

FINDINGS: Brain: Diffusely enlarged ventricles and subarachnoid spaces. No
intracranial hemorrhage, mass lesion or CT evidence of acute
infarction.

Vascular: No hyperdense vessel or unexpected calcification.

Skull: Small scalloped defect in the outer portion of the left
frontal bone compatible with an old burr hole.

Sinuses/Orbits: No acute finding.

Other: None.
IMPRESSION: No acute abnormality. Moderate diffuse cerebral and cerebellar
atrophy.

## 2017-12-02 ENCOUNTER — Other Ambulatory Visit: Payer: Self-pay | Admitting: Family Medicine

## 2017-12-05 ENCOUNTER — Other Ambulatory Visit: Payer: Self-pay | Admitting: Family Medicine

## 2017-12-28 ENCOUNTER — Other Ambulatory Visit: Payer: Self-pay | Admitting: Family Medicine

## 2018-01-15 NOTE — Progress Notes (Addendum)
Subjective:   Steve Wiggins is a 77 y.o. male who presents for Medicare Annual/Subsequent preventive examination.  Review of Systems:  No ROS.  Medicare Wellness Visit. Additional risk factors are reflected in the social history.  Cardiac Risk Factors include: advanced age (>62men, >2 women);diabetes mellitus;male gender;hypertension;dyslipidemia;obesity (BMI >30kg/m2);family history of premature cardiovascular disease   Sleep patterns: Sleeps well, up to void x 4-5.  Home Safety/Smoke Alarms: Feels safe in home. Smoke alarms in place.  Living environment; residence and Firearm Safety: Lives alone in 1 story home, 3 steps with rail at door. Seat Belt Safety/Bike Helmet: Wears seat belt.   Male:   CCS-Colonoscopy 08/14/2011, normal.      PSA-Followed by [facility]   Lab Results  Component Value Date   PSA 2.15 05/21/2017   PSA 1.36 10/22/2016   PSA 1.44 09/12/2015       Objective:    Vitals: BP (!) 142/64 (BP Location: Left Arm, Patient Position: Sitting, Cuff Size: Normal)   Pulse (!) 56   Temp 98.2 F (36.8 C) (Temporal)   Resp 16   Ht 5\' 8"  (1.727 m)   Wt 231 lb 6 oz (105 kg)   SpO2 98%   BMI 35.18 kg/m   Body mass index is 35.18 kg/m.  Advanced Directives 01/16/2018 10/31/2016 06/04/2012 03/21/2012  Does Patient Have a Medical Advance Directive? Yes No Patient does not have advance directive;Patient would not like information Patient does not have advance directive;Patient would like information  Type of Advance Directive Healthcare Power of [place];Living will - - -  Copy of Healthcare Power of Attorney in Chart? Yes - - -  Would patient like information on creating a medical advance directive? - Yes (MAU/Ambulatory/Procedural Areas - Information given) - Advance directive packet given  Pre-existing out of facility DNR order (yellow form or pink MOST form) - - No No    Tobacco Social History   Tobacco Use  Smoking Status Former Smoker  Smokeless  Tobacco Never Used  Tobacco Comment   quit over 50 yrs ago.     Counseling given: Not Answered Comment: quit over 50 yrs ago.    Past Medical History:  Diagnosis Date  . Coronary artery disease    a. 07/2010 echo: nl EF;  b. 08/2010 Cath/PCI: Resolute DES to OM  . Diabetes mellitus    type 2  . GERD (gastroesophageal reflux disease)   . History of gastric ulcer   . History of gout   . History of head injury FELL OFF LADDER,  1999--  S/P DRAINAGE SUBDERMAL HEMATOMA--  NO RESIDUAL  . Hyperlipidemia   . Hypertension   . Mild carotid artery disease (HCC)    a. 07/2010 Carotid U/S: bilat ICA 0-35%   . Obesity   . Penile lesion   . Umbilical hernia    Past Surgical History:  Procedure Laterality Date  . CARDIOVASCULAR STRESS TEST  08-28-2010  ([place], [place])   EF 66%/ SMALL AREA PROXIMAL POSTEROLATERAL ISCHEMIA  . cataract surgery    . CHOLECYSTECTOMY  06/05/2012   Procedure: LAPAROSCOPIC CHOLECYSTECTOMY;  Surgeon: Emelia Loron, MD;  Location: [facility] OR;  Service: General;  Laterality: N/A;  . CORONARY ANGIOPLASTY WITH STENT PLACEMENT  09-26-2011  ([place], [place])   DRUG-ELUTING STENT X1 TO FIRST OBTUSE MARGINAL BRANCH CIRCUMFLEX/ MODERATE LESION DIAGINAL BRANCH TREATED MEDICALLY  . SURG FOR DRAINAGE SUBDURAL  HEMATOMA  1999  . TRANSTHORACIC ECHOCARDIOGRAM  08-21-2010   LVF NORMAL/ MILD MITRAL AND TRICUSID REGURG.  Marland Kitchen  UMBILICAL HERNIA REPAIR  06/05/2012   Procedure: HERNIA REPAIR UMBILICAL ADULT;  Surgeon: Emelia LoronMatthew Wakefield, MD;  Location[facility] OR;  Service: General;  Laterality: N/A;  Primary Umbilical Hernia Repair   Family History  Problem Relation Age of Onset  . Heart attack Mother   . Stroke Mother   . Diabetes Mother   . Heart attack Maternal Grandmother   . Mental retardation Brother   . Hypertension Son   . Heart disease Son    Social History   Socioeconomic History  . Marital status: Married    Spouse name: Not on file  . Number of children: Not on file  . Years of  education: Not on file  . Highest education level: Not on file  Occupational History  . Not on file  Social Needs  . Financial resource strain: Not on file  . Food insecurity:    Worry: Not on file    Inability: Not on file  . Transportation needs:    Medical: Not on file    Non-medical: Not on file  Tobacco Use  . Smoking status: Former Games developermoker  . Smokeless tobacco: Never Used  . Tobacco comment: quit over 50 yrs ago.  Substance and Sexual Activity  . Alcohol use: No    Comment: previously had an occasional drink but nothing in 50 yrs.  . Drug use: No  . Sexual activity: Not on file  Lifestyle  . Physical activity:    Days per week: Not on file    Minutes per session: Not on file  . Stress: Not on file  Relationships  . Social connections:    Talks on phone: Not on file    Gets together: Not on file    Attends religious service: Not on file    Active member of club or organization: Not on file    Attends meetings of clubs or organizations: Not on file    Relationship status: Not on file  Other Topics Concern  . Not on file  Social History Narrative   Lives in [place] with his wife.    Outpatient Encounter Medications as of 01/16/2018  Medication Sig  . acetaminophen (TYLENOL) 325 MG tablet Take 650 mg by mouth every 6 (six) hours as needed (pain).  Marland Kitchen. amLODipine (NORVASC) 10 MG tablet TAKE 1 TABLET (10 MG TOTAL) BY MOUTH EVERY MORNING.  Marland Kitchen. aspirin 81 MG tablet Take 81 mg by mouth every morning.   . carvedilol (COREG) 12.5 MG tablet TAKE 1 TABLET (12.5 MG TOTAL) BY MOUTH 2 (TWO) TIMES DAILY WITH A MEAL.  . Dulaglutide (TRULICITY) 1.5 MG/0.5ML SOPN Inject 1.5 mg into the skin once a week.  . fenofibrate 160 MG tablet TAKE 1 TABLET (160 MG TOTAL) DAILY BY MOUTH.  . fluticasone (FLONASE) 50 MCG/ACT nasal spray Place 2 sprays into both nostrils daily. (Patient taking differently: Place 2 sprays into both nostrils as needed. )  . glimepiride (AMARYL) 4 MG tablet TAKE 1 TABLET (4  MG TOTAL) BY MOUTH DAILY BEFORE BREAKFAST.  . hydrALAZINE (APRESOLINE) 50 MG tablet TAKE 1 TABLET (50 MG TOTAL) BY MOUTH 2 (TWO) TIMES DAILY.  . hydrochlorothiazide (HYDRODIURIL) 25 MG tablet TAKE 1 TABLET BY MOUTH DAILY  . Lancets (ONETOUCH ULTRASOFT) lancets TEST SUGARS TWICE A DAY .DX. E11.40  . levothyroxine (SYNTHROID, LEVOTHROID) 50 MCG tablet TAKE 1 TABLET (50 MCG TOTAL) BY MOUTH DAILY.  Marland Kitchen. lisinopril (PRINIVIL,ZESTRIL) 40 MG tablet TAKE 1 TABLET BY MOUTH EVERY MORNING  . metFORMIN (GLUCOPHAGE) 1000  MG tablet TAKE 1 TABLET (1,000 MG TOTAL) BY MOUTH 2 (TWO) TIMES DAILY.  Marland Kitchen omeprazole (PRILOSEC) 40 MG capsule TAKE 1 CAPSULE BY MOUTH EVERY EVENING.  . ONE TOUCH ULTRA TEST test strip USE TO CHECK SUGARS TWICE DAILY. DX E11.40  . PAZEO 0.7 % SOLN   . rosuvastatin (CRESTOR) 10 MG tablet TAKE 1 TABLET BY MOUTH DAILY  . tamsulosin (FLOMAX) 0.4 MG CAPS capsule Take 0.8 mg by mouth daily.   . [DISCONTINUED] traZODone (DESYREL) 50 MG tablet Take 0.5-1 tablets (25-50 mg total) by mouth at bedtime as needed for sleep.   No facility-administered encounter medications on file as of 01/16/2018.     Activities of Daily Living In your present state of health, do you have any difficulty performing the following activities: 01/16/2018 09/09/2017  Hearing? N N  Vision? N N  Difficulty concentrating or making decisions? N N  Walking or climbing stairs? N N  Dressing or bathing? N N  Doing errands, shopping? N N  Preparing Food and eating ? N -  Using the Toilet? N -  In the past six months, have you accidently leaked urine? N -  Do you have problems with loss of bowel control? N -  Managing your Medications? N -  Managing your Finances? N -  Housekeeping or managing your Housekeeping? N -  Some recent data might be hidden    Patient Care Team: Sheliah Hatch, MD as PCP - General (Family Medicine) Tonny Bollman, MD as Attending Physician (Cardiology) Ihor Gully, MD as Consulting Physician  (Urology)   Assessment:   This is a routine wellness examination for [place].  Exercise Activities and Dietary recommendations Current Exercise Habits: Home exercise routine, Type of exercise: walking, Time (Minutes): 15, Frequency (Times/Week): 3, Weekly Exercise (Minutes/Week): 45, Exercise limited by: None identified   Diet (meal preparation, eat out, water intake, caffeinated beverages, dairy products, fruits and vegetables): Drinks water and coffee (3-4 cups).   Breakfast: eggs; occasional potato; whole wheat bread, tomato slices, coffee (black) Lunch: vegetables Dinner: protein, vegetables, potato (small, baked) Snacks: Atkins bars; cottage cheese with tomato  Goals    . Weight (lb) < 210 lb (95.3 kg)     Lose weight by increasing activity and watching carb content.        Fall Risk Fall Risk  01/16/2018 09/09/2017 05/21/2017 10/31/2016 01/10/2016  Falls in the past year? No No No Yes No  Number falls in past yr: - - - 1 -  Injury with Fall? - - - No -    Depression Screen PHQ 2/9 Scores 01/16/2018 09/09/2017 05/21/2017 10/31/2016  PHQ - 2 Score 0 2 2 2   PHQ- 9 Score - 2 2 -  Exception Documentation - - - -    Cognitive Function MMSE - Mini Mental State Exam 01/16/2018 10/31/2016  Orientation to time 5 5  Orientation to Place 5 5  Registration 3 3  Attention/ Calculation 4 5  Recall 3 2  Language- name 2 objects 2 2  Language- repeat 1 1  Language- follow 3 step command 3 3  Language- read & follow direction 1 1  Write a sentence 1 1  Copy design 1 1  Total score 29 29        Immunization History  Administered Date(s) Administered  . Influenza, High Dose Seasonal PF 05/17/2014, 05/07/2017  . Influenza,inj,Quad PF,6+ Mos 04/27/2015, 05/09/2016  . Pneumococcal Conjugate-13 11/17/2014  . Pneumococcal Polysaccharide-23 06/05/2012  . Zoster 04/22/2013  Screening Tests Health Maintenance  Topic Date Due  . TETANUS/TDAP  01/17/2019 (Originally 07/12/1960)  .  FOOT EXAM  01/17/2018  . INFLUENZA VACCINE  02/20/2018  . HEMOGLOBIN A1C  03/09/2018  . OPHTHALMOLOGY EXAM  09/21/2018  . COLONOSCOPY  08/13/2021  . PNA vac Low Risk Adult  Completed        Plan:    Continue doing brain stimulating activities (puzzles, reading, adult coloring books, staying active) to keep memory sharp.   Call insurance about Silver Sneakers.   I have personally reviewed and noted the following in the patient's chart:   . Medical and social history . Use of alcohol, tobacco or illicit drugs  . Current medications and supplements . Functional ability and status . Nutritional status . Physical activity . Advanced directives . List of other physicians . Hospitalizations, surgeries, and ER visits in previous 12 months . Vitals . Screenings to include cognitive, depression, and falls . Referrals and appointments  In addition, I have reviewed and discussed with patient certain preventive protocols, quality metrics, and best practice recommendations. A written personalized care plan for preventive services as well as general preventive health recommendations were provided to patient.     Alysia Penna, RN  01/16/2018  Reviewed documentation provided by RN and agree w/ above.  Neena Rhymes, MD

## 2018-01-16 ENCOUNTER — Ambulatory Visit (INDEPENDENT_AMBULATORY_CARE_PROVIDER_SITE_OTHER): Payer: PPO | Admitting: Family Medicine

## 2018-01-16 ENCOUNTER — Encounter: Payer: Self-pay | Admitting: Family Medicine

## 2018-01-16 ENCOUNTER — Other Ambulatory Visit: Payer: Self-pay

## 2018-01-16 ENCOUNTER — Ambulatory Visit (INDEPENDENT_AMBULATORY_CARE_PROVIDER_SITE_OTHER): Payer: PPO

## 2018-01-16 VITALS — BP 142/64 | HR 56 | Temp 98.2°F | Resp 16 | Ht 68.0 in | Wt 231.4 lb

## 2018-01-16 VITALS — BP 126/86 | HR 56 | Temp 98.2°F | Resp 16 | Ht 68.0 in | Wt 231.4 lb

## 2018-01-16 DIAGNOSIS — E782 Mixed hyperlipidemia: Secondary | ICD-10-CM

## 2018-01-16 DIAGNOSIS — Z0000 Encounter for general adult medical examination without abnormal findings: Secondary | ICD-10-CM | POA: Diagnosis not present

## 2018-01-16 DIAGNOSIS — E1140 Type 2 diabetes mellitus with diabetic neuropathy, unspecified: Secondary | ICD-10-CM

## 2018-01-16 DIAGNOSIS — E669 Obesity, unspecified: Secondary | ICD-10-CM | POA: Diagnosis not present

## 2018-01-16 DIAGNOSIS — L600 Ingrowing nail: Secondary | ICD-10-CM

## 2018-01-16 DIAGNOSIS — I10 Essential (primary) hypertension: Secondary | ICD-10-CM | POA: Diagnosis not present

## 2018-01-16 DIAGNOSIS — E039 Hypothyroidism, unspecified: Secondary | ICD-10-CM

## 2018-01-16 LAB — BASIC METABOLIC PANEL
BUN: 12 mg/dL (ref 6–23)
CALCIUM: 9.4 mg/dL (ref 8.4–10.5)
CO2: 27 meq/L (ref 19–32)
Chloride: 105 mEq/L (ref 96–112)
Creatinine, Ser: 0.93 mg/dL (ref 0.40–1.50)
GFR: 84.3 mL/min (ref >60.00)
Glucose, Bld: 202 mg/dL — ABNORMAL HIGH (ref 70–99)
Potassium: 4.7 mEq/L (ref 3.5–5.1)
Sodium: 139 mEq/L (ref 135–145)

## 2018-01-16 LAB — LIPID PANEL
Cholesterol: 110 mg/dL (ref 0–200)
HDL: 28.4 mg/dL — ABNORMAL LOW (ref >39.00)
LDL Cholesterol: 42 mg/dL (ref 0–99)
NONHDL: 82
TRIGLYCERIDES: 200 mg/dL — AB (ref 0.0–149.0)
Total CHOL/HDL Ratio: 4
VLDL: 40 mg/dL (ref 0.0–40.0)

## 2018-01-16 LAB — HEPATIC FUNCTION PANEL
ALBUMIN: 4.2 g/dL (ref 3.5–5.2)
ALT: 27 U/L (ref 0–53)
AST: 30 U/L (ref 0–37)
Alkaline Phosphatase: 46 U/L (ref 39–117)
BILIRUBIN DIRECT: 0.1 mg/dL (ref 0.0–0.3)
TOTAL PROTEIN: 6.6 g/dL (ref 6.0–8.3)
Total Bilirubin: 0.4 mg/dL (ref 0.2–1.2)

## 2018-01-16 LAB — CBC WITH DIFFERENTIAL/PLATELET
BASOS PCT: 0.9 % (ref 0.0–3.0)
Basophils Absolute: 0.1 10*3/uL (ref 0.0–0.1)
EOS ABS: 0.2 10*3/uL (ref 0.0–0.7)
Eosinophils Relative: 2.9 % (ref 0.0–5.0)
HEMATOCRIT: 38.8 % — AB (ref 39.0–52.0)
HEMOGLOBIN: 13.4 g/dL (ref 13.0–17.0)
LYMPHS PCT: 28 % (ref 12.0–46.0)
Lymphs Abs: 1.9 10*3/uL (ref 0.7–4.0)
MCHC: 34.5 g/dL (ref 30.0–36.0)
MCV: 91.8 fl (ref 78.0–100.0)
Monocytes Absolute: 0.5 10*3/uL (ref 0.1–1.0)
Monocytes Relative: 7.8 % (ref 3.0–12.0)
Neutro Abs: 4.1 10*3/uL (ref 1.4–7.7)
Neutrophils Relative %: 60.4 % (ref 43.0–77.0)
Platelets: 136 10*3/uL — ABNORMAL LOW (ref 150.0–400.0)
RBC: 4.23 Mil/uL (ref 4.22–5.81)
RDW: 13.6 % (ref 11.5–15.5)
WBC: 6.7 10*3/uL (ref 4.0–10.5)

## 2018-01-16 LAB — TSH: TSH: 3.82 u[IU]/mL (ref 0.35–4.50)

## 2018-01-16 LAB — HEMOGLOBIN A1C: HEMOGLOBIN A1C: 8.7 % — AB (ref 4.6–6.5)

## 2018-01-16 MED ORDER — CANAGLIFLOZIN 100 MG PO TABS: 100.0000 mg | ORAL_TABLET | Freq: Every day | ORAL | 3 refills | Status: DC

## 2018-01-16 NOTE — Progress Notes (Signed)
   Subjective:    Patient ID: Steve Wiggins, male    DOB: February 15, 1941, 77 y.o.   MRN: [id number]  HPI DM- chronic problem, on Trulicity 1.5mg  weekly, Amaryl 4mg  daily, Metformin 1000mg  BID.  UTD on eye exam, on ACE for renal protection.  Due for foot exam.  Sugars remain in the 200-300 range despite eating a low carb diet.  Denies  numbness/tingling of hands/feet above baseline.  Pt wants to stop the Metformin due to GI issues.  HTN- chronic problem, on Lisinopril 40mg  daily, HCTZ 25mg  daily, Hydralazine 50mg  BID, Coreg 12.5mg  BID, and Amlodipine 10mg  daily.  No CP, SOB, HAs,   Hyperlipidemia- chronic problem, on Crestor 10mg  and Fenofibrate 160mg  daily.  No abd pain, N/V.   Review of Systems For ROS see HPI     Objective:   Physical Exam  Constitutional: He is oriented to person, place, and time. He appears well-developed and well-nourished. No distress.  obese  HENT:  Head: Normocephalic and atraumatic.  Eyes: Pupils are equal, round, and reactive to light. Conjunctivae and EOM are normal.  Neck: Normal range of motion. Neck supple. No thyromegaly present.  Cardiovascular: Normal rate, regular rhythm, normal heart sounds and intact distal pulses.  No murmur heard. Pulmonary/Chest: Effort normal and breath sounds normal. No respiratory distress.  Abdominal: Soft. Bowel sounds are normal. He exhibits no distension.  Musculoskeletal: He exhibits no edema.  Lymphadenopathy:    He has no cervical adenopathy.  Neurological: He is alert and oriented to person, place, and time. No cranial nerve deficit.  Skin: Skin is warm and dry.  Psychiatric: He has a normal mood and affect. His behavior is normal.  Vitals reviewed.         Assessment & Plan:

## 2018-01-16 NOTE — Assessment & Plan Note (Signed)
Chronic problem.  Continues to struggle w/ tolerating Metformin.  Trulicity is at max dose.  Sugars remain elevated.  Attempted to prescribe Jardiance but formulary prefers Invokana- prescription sent to pharmacy.  Stressed need for healthy diet and regular exercise.  On ACE for renal protection, UTD on eye exam, foot exam done today.  Will have him return in 1 month to recheck sugars and kidney fxn.  Pt expressed understanding and is in agreement w/ plan.

## 2018-01-16 NOTE — Patient Instructions (Addendum)
Continue doing brain stimulating activities (puzzles, reading, adult coloring books, staying active) to keep memory sharp.   Call insurance about Silver Sneakers.   Health Maintenance, Male A healthy lifestyle and preventive care is important for your health and wellness. Ask your health care provider about what schedule of regular examinations is right for you. What should I know about weight and diet? Eat a Healthy Diet  Eat plenty of vegetables, fruits, whole grains, low-fat dairy products, and lean protein.  Do not eat a lot of foods high in solid fats, added sugars, or salt.  Maintain a Healthy Weight Regular exercise can help you achieve or maintain a healthy weight. You should:  Do at least 150 minutes of exercise each week. The exercise should increase your heart rate and make you sweat (moderate-intensity exercise).  Do strength-training exercises at least twice a week.  Watch Your Levels of Cholesterol and Blood Lipids  Have your blood tested for lipids and cholesterol every 5 years starting at 75 years of age. If you are at high risk for heart disease, you should start having your blood tested when you are 75 years old. You may need to have your cholesterol levels checked more often if: ? Your lipid or cholesterol levels are high. ? You are older than 75 years of age. ? You are at high risk for heart disease.  What should I know about cancer screening? Many types of cancers can be detected early and may often be prevented. Lung Cancer  You should be screened every year for lung cancer if: ? You are a current smoker who has smoked for at least 30 years. ? You are a former smoker who has quit within the past 15 years.  Talk to your health care provider about your screening options, when you should start screening, and how often you should be screened.  Colorectal Cancer  Routine colorectal cancer screening usually begins at 75 years of age and should be repeated every  5-10 years until you are 75 years old. You may need to be screened more often if early forms of precancerous polyps or small growths are found. Your health care provider may recommend screening at an earlier age if you have risk factors for colon cancer.  Your health care provider may recommend using home test kits to check for hidden blood in the stool.  A small camera at the end of a tube can be used to examine your colon (sigmoidoscopy or colonoscopy). This checks for the earliest forms of colorectal cancer.  Prostate and Testicular Cancer  Depending on your age and overall health, your health care provider may do certain tests to screen for prostate and testicular cancer.  Talk to your health care provider about any symptoms or concerns you have about testicular or prostate cancer.  Skin Cancer  Check your skin from head to toe regularly.  Tell your health care provider about any new moles or changes in moles, especially if: ? There is a change in a mole's size, shape, or color. ? You have a mole that is larger than a pencil eraser.  Always use sunscreen. Apply sunscreen liberally and repeat throughout the day.  Protect yourself by wearing long sleeves, pants, a wide-brimmed hat, and sunglasses when outside.  What should I know about heart disease, diabetes, and high blood pressure?  If you are 5318-75 years of age, have your blood pressure checked every 3-5 years. If you are 75 years of age or older, have  your blood pressure checked every year. You should have your blood pressure measured twice-once when you are at a hospital or clinic, and once when you are not at a hospital or clinic. Record the average of the two measurements. To check your blood pressure when you are not at a hospital or clinic, you can use: ? An automated blood pressure machine at a pharmacy. ? A home blood pressure monitor.  Talk to your health care provider about your target blood pressure.  If you are  between 44-29 years old, ask your health care provider if you should take aspirin to prevent heart disease.  Have regular diabetes screenings by checking your fasting blood sugar level. ? If you are at a normal weight and have a low risk for diabetes, have this test once every three years after the age of 67. ? If you are overweight and have a high risk for diabetes, consider being tested at a younger age or more often.  A one-time screening for abdominal aortic aneurysm (AAA) by ultrasound is recommended for men aged 65-75 years who are current or former smokers. What should I know about preventing infection? Hepatitis B If you have a higher risk for hepatitis B, you should be screened for this virus. Talk with your health care provider to find out if you are at risk for hepatitis B infection. Hepatitis C Blood testing is recommended for:  Everyone born from [birth year] through 1963.  Anyone with known risk factors for hepatitis C.  Sexually Transmitted Diseases (STDs)  You should be screened each year for STDs including gonorrhea and chlamydia if: ? You are sexually active and are younger than 75 years of age. ? You are older than 75 years of age and your health care provider tells you that you are at risk for this type of infection. ? Your sexual activity has changed since you were last screened and you are at an increased risk for chlamydia or gonorrhea. Ask your health care provider if you are at risk.  Talk with your health care provider about whether you are at high risk of being infected with HIV. Your health care provider may recommend a prescription medicine to help prevent HIV infection.  What else can I do?  Schedule regular health, dental, and eye exams.  Stay current with your vaccines (immunizations).  Do not use any tobacco products, such as cigarettes, chewing tobacco, and e-cigarettes. If you need help quitting, ask your health care provider.  Limit alcohol intake to no  more than 2 drinks per day. One drink equals 12 ounces of beer, 5 ounces of wine, or 1 ounces of hard liquor.  Do not use street drugs.  Do not share needles.  Ask your health care provider for help if you need support or information about quitting drugs.  Tell your health care provider if you often feel depressed.  Tell your health care provider if you have ever been abused or do not feel safe at home. This information is not intended to replace advice given to you by your health care provider. Make sure you discuss any questions you have with your health care provider. Document Released: 01/05/2008 Document Revised: 03/07/2016 Document Reviewed: 04/12/2015 Elsevier Interactive Patient Education  2018 [employer].   Diabetes Mellitus and Nutrition When you have diabetes (diabetes mellitus), it is very important to have healthy eating habits because your blood sugar (glucose) levels are greatly affected by what you eat and drink. Eating healthy foods in  the appropriate amounts, at about the same times every day, can help you:  Control your blood glucose.  Lower your risk of heart disease.  Improve your blood pressure.  Reach or maintain a healthy weight.  Every person with diabetes is different, and each person has different needs for a meal plan. Your health care provider may recommend that you work with a diet and nutrition specialist (dietitian) to make a meal plan that is best for you. Your meal plan may vary depending on factors such as:  The calories you need.  The medicines you take.  Your weight.  Your blood glucose, blood pressure, and cholesterol levels.  Your activity level.  Other health conditions you have, such as heart or kidney disease.  How do carbohydrates affect me? Carbohydrates affect your blood glucose level more than any other type of food. Eating carbohydrates naturally increases the amount of glucose in your blood. Carbohydrate counting is a  method for keeping track of how many carbohydrates you eat. Counting carbohydrates is important to keep your blood glucose at a healthy level, especially if you use insulin or take certain oral diabetes medicines. It is important to know how many carbohydrates you can safely have in each meal. This is different for every person. Your dietitian can help you calculate how many carbohydrates you should have at each meal and for snack. Foods that contain carbohydrates include:  Bread, cereal, rice, pasta, and crackers.  Potatoes and corn.  Peas, beans, and lentils.  Milk and yogurt.  Fruit and juice.  Desserts, such as cakes, cookies, ice cream, and candy.  How does alcohol affect me? Alcohol can cause a sudden decrease in blood glucose (hypoglycemia), especially if you use insulin or take certain oral diabetes medicines. Hypoglycemia can be a life-threatening condition. Symptoms of hypoglycemia (sleepiness, dizziness, and confusion) are similar to symptoms of having too much alcohol. If your health care provider says that alcohol is safe for you, follow these guidelines:  Limit alcohol intake to no more than 1 drink per day for nonpregnant women and 2 drinks per day for men. One drink equals 12 oz of beer, 5 oz of wine, or 1 oz of hard liquor.  Do not drink on an empty stomach.  Keep yourself hydrated with water, diet soda, or unsweetened iced tea.  Keep in mind that regular soda, juice, and other mixers may contain a lot of sugar and must be counted as carbohydrates.  What are tips for following this plan? Reading food labels  Start by checking the serving size on the label. The amount of calories, carbohydrates, fats, and other nutrients listed on the label are based on one serving of the food. Many foods contain more than one serving per package.  Check the total grams (g) of carbohydrates in one serving. You can calculate the number of servings of carbohydrates in one serving by  dividing the total carbohydrates by 15. For example, if a food has 30 g of total carbohydrates, it would be equal to 2 servings of carbohydrates.  Check the number of grams (g) of saturated and trans fats in one serving. Choose foods that have low or no amount of these fats.  Check the number of milligrams (mg) of sodium in one serving. Most people should limit total sodium intake to less than 2,300 mg per day.  Always check the nutrition information of foods labeled as "low-fat" or "nonfat". These foods may be higher in added sugar or refined carbohydrates and  should be avoided.  Talk to your dietitian to identify your daily goals for nutrients listed on the label. Shopping  Avoid buying canned, premade, or processed foods. These foods tend to be high in fat, sodium, and added sugar.  Shop around the outside edge of the grocery store. This includes fresh fruits and vegetables, bulk grains, fresh meats, and fresh dairy. Cooking  Use low-heat cooking methods, such as baking, instead of high-heat cooking methods like deep frying.  Cook using healthy oils, such as olive, canola, or sunflower oil.  Avoid cooking with butter, cream, or high-fat meats. Meal planning  Eat meals and snacks regularly, preferably at the same times every day. Avoid going long periods of time without eating.  Eat foods high in fiber, such as fresh fruits, vegetables, beans, and whole grains. Talk to your dietitian about how many servings of carbohydrates you can eat at each meal.  Eat 4-6 ounces of lean protein each day, such as lean meat, chicken, fish, eggs, or tofu. 1 ounce is equal to 1 ounce of meat, chicken, or fish, 1 egg, or 1/4 cup of tofu.  Eat some foods each day that contain healthy fats, such as avocado, nuts, seeds, and fish. Lifestyle   Check your blood glucose regularly.  Exercise at least 30 minutes 5 or more days each week, or as told by your health care provider.  Take medicines as told by  your health care provider.  Do not use any products that contain nicotine or tobacco, such as cigarettes and e-cigarettes. If you need help quitting, ask your health care provider.  Work with a Veterinary surgeon or diabetes educator to identify strategies to manage stress and any emotional and social challenges. What are some questions to ask my health care provider?  Do I need to meet with a diabetes educator?  Do I need to meet with a dietitian?  What number can I call if I have questions?  When are the best times to check my blood glucose? Where to find more information:  American Diabetes Association: diabetes.org/food-and-fitness/food  Academy of Nutrition and Dietetics: https://www.vargas.com/  [employer] of Diabetes and Digestive and Kidney Diseases (NIH): FindJewelers.cz Summary  A healthy meal plan will help you control your blood glucose and maintain a healthy lifestyle.  Working with a diet and nutrition specialist (dietitian) can help you make a meal plan that is best for you.  Keep in mind that carbohydrates and alcohol have immediate effects on your blood glucose levels. It is important to count carbohydrates and to use alcohol carefully. This information is not intended to replace advice given to you by your health care provider. Make sure you discuss any questions you have with your health care provider. Document Released: 04/05/2005 Document Revised: 08/13/2016 Document Reviewed: 08/13/2016 Elsevier Interactive Patient Education  [employer].

## 2018-01-16 NOTE — Assessment & Plan Note (Signed)
Chronic problem.  Currently asymptomatic.  Check labs.  Adjust meds prn  

## 2018-01-16 NOTE — Assessment & Plan Note (Signed)
Chronic problem.  Initially elevated but returned to normal before leaving the office.  Asymptomatic.  Check labs.  No anticipated med changes.  Will follow.

## 2018-01-16 NOTE — Assessment & Plan Note (Signed)
Ongoing issue.  Given his BMI of 35.2 and his comorbidities of diabetes, HTN, hyperlipidemia- he officially qualifies as morbidly obese.  Encouraged healthy diet and regular exercise.  Will follow.

## 2018-01-16 NOTE — Patient Instructions (Addendum)
Follow up in 1 month to recheck sugars (please bring your list!) We'll notify you of your lab results and make any changes if needed Continue to work on low carb diet and regular exercise- you can do it! We'll call you with your podiatry appt for the ingrown toenail STOP the Metformin START the Invokana once daily Call with any questions or concerns Happy 4th of July!!

## 2018-01-16 NOTE — Assessment & Plan Note (Signed)
Chronic problem.  Tolerating statin w/o difficulty.  Encouraged healthy diet and regular exercise.  Check labs.  Adjust meds prn. 

## 2018-01-17 ENCOUNTER — Encounter: Payer: Self-pay | Admitting: General Practice

## 2018-01-30 ENCOUNTER — Ambulatory Visit (INDEPENDENT_AMBULATORY_CARE_PROVIDER_SITE_OTHER): Payer: PPO | Admitting: Podiatry

## 2018-01-30 ENCOUNTER — Encounter: Payer: Self-pay | Admitting: Podiatry

## 2018-01-30 DIAGNOSIS — B351 Tinea unguium: Secondary | ICD-10-CM

## 2018-01-30 DIAGNOSIS — E1165 Type 2 diabetes mellitus with hyperglycemia: Secondary | ICD-10-CM | POA: Diagnosis not present

## 2018-01-30 DIAGNOSIS — L600 Ingrowing nail: Secondary | ICD-10-CM | POA: Diagnosis not present

## 2018-01-30 DIAGNOSIS — M79674 Pain in right toe(s): Secondary | ICD-10-CM | POA: Diagnosis not present

## 2018-01-30 DIAGNOSIS — M79675 Pain in left toe(s): Secondary | ICD-10-CM

## 2018-01-30 LAB — HM DIABETES FOOT EXAM

## 2018-01-30 NOTE — Patient Instructions (Signed)
Diabetes and Foot Care Diabetes may cause you to have problems because of poor blood supply (circulation) to your feet and legs. This may cause the skin on your feet to become thinner, break easier, and heal more slowly. Your skin may become dry, and the skin may peel and crack. You may also have nerve damage in your legs and feet causing decreased feeling in them. You may not notice minor injuries to your feet that could lead to infections or more serious problems. Taking care of your feet is one of the most important things you can do for yourself. Follow these instructions at home:  Wear shoes at all times, even in the house. Do not go barefoot. Bare feet are easily injured.  Check your feet daily for blisters, cuts, and redness. If you cannot see the bottom of your feet, use a mirror or ask someone for help.  Wash your feet with warm water (do not use hot water) and mild soap. Then pat your feet and the areas between your toes until they are completely dry. Do not soak your feet as this can dry your skin.  Apply a moisturizing lotion or petroleum jelly (that does not contain alcohol and is unscented) to the skin on your feet and to dry, brittle toenails. Do not apply lotion between your toes.  Trim your toenails straight across. Do not dig under them or around the cuticle. File the edges of your nails with an emery board or nail file.  Do not cut corns or calluses or try to remove them with medicine.  Wear clean socks or stockings every day. Make sure they are not too tight. Do not wear knee-high stockings since they may decrease blood flow to your legs.  Wear shoes that fit properly and have enough cushioning. To break in new shoes, wear them for just a few hours a day. This prevents you from injuring your feet. Always look in your shoes before you put them on to be sure there are no objects inside.  Do not cross your legs. This may decrease the blood flow to your feet.  If you find a  minor scrape, cut, or break in the skin on your feet, keep it and the skin around it clean and dry. These areas may be cleansed with mild soap and water. Do not cleanse the area with peroxide, alcohol, or iodine.  When you remove an adhesive bandage, be sure not to damage the skin around it.  If you have a wound, look at it several times a day to make sure it is healing.  Do not use heating pads or hot water bottles. They may burn your skin. If you have lost feeling in your feet or legs, you may not know it is happening until it is too late.  Make sure your health care provider performs a complete foot exam at least annually or more often if you have foot problems. Report any cuts, sores, or bruises to your health care provider immediately. Contact a health care provider if:  You have an injury that is not healing.  You have cuts or breaks in the skin.  You have an ingrown nail.  You notice redness on your legs or feet.  You feel burning or tingling in your legs or feet.  You have pain or cramps in your legs and feet.  Your legs or feet are numb.  Your feet always feel cold. Get help right away if:  There is increasing   redness, swelling, or pain in or around a wound.  There is a red line that goes up your leg.  Pus is coming from a wound.  You develop a fever or as directed by your health care provider.  You notice a bad smell coming from an ulcer or wound. This information is not intended to replace advice given to you by your health care provider. Make sure you discuss any questions you have with your health care provider. Document Released: 07/06/2000 Document Revised: 12/15/2015 Document Reviewed: 12/16/2012 Elsevier Interactive Patient Education  2017 [employer] Ingrown Toenail An ingrown toenail occurs when the corner or sides of your toenail grow into the surrounding skin. The big toe is most commonly affected, but it can happen to any of your toes. If your ingrown  toenail is not treated, you will be at risk for infection. What are the causes? This condition may be caused by:  Wearing shoes that are too small or tight.  Injury or trauma, such as stubbing your toe or having your toe stepped on.  Improper cutting or care of your toenails.  Being born with (congenital) nail or foot abnormalities, such as having a nail that is too big for your toe.  What increases the risk? Risk factors for an ingrown toenail include:  Age. Your nails tend to thicken as you get older, so ingrown nails are more common in older people.  Diabetes.  Cutting your toenails incorrectly.  Blood circulation problems.  What are the signs or symptoms? Symptoms may include:  Pain, soreness, or tenderness.  Redness.  Swelling.  Hardening of the skin surrounding the toe.  Your ingrown toenail may be infected if there is fluid, pus, or drainage. How is this diagnosed? An ingrown toenail may be diagnosed by medical history and physical exam. If your toenail is infected, your health care provider may test a sample of the drainage. How is this treated? Treatment depends on the severity of your ingrown toenail. Some ingrown toenails may be treated at home. More severe or infected ingrown toenails may require surgery to remove all or part of the nail. Infected ingrown toenails may also be treated with antibiotic medicines. Follow these instructions at home:  If you were prescribed an antibiotic medicine, finish all of it even if you start to feel better.  Soak your foot in warm soapy water for 20 minutes, 3 times per day or as directed by your health care provider.  Carefully lift the edge of the nail away from the sore skin by wedging a small piece of cotton under the corner of the nail. This may help with the pain. Be careful not to cause more injury to the area.  Wear shoes that fit well. If your ingrown toenail is causing you pain, try wearing sandals, if  possible.  Trim your toenails regularly and carefully. Do not cut them in a curved shape. Cut your toenails straight across. This prevents injury to the skin at the corners of the toenail.  Keep your feet clean and dry.  If you are having trouble walking and are given crutches by your health care provider, use them as directed.  Do not pick at your toenail or try to remove it yourself.  Take medicines only as directed by your health care provider.  Keep all follow-up visits as directed by your health care provider. This is important. Contact a health care provider if:  Your symptoms do not improve with treatment. Get help right away   if:  You have red streaks that start at your foot and go up your leg.  You have a fever.  You have increased redness, swelling, or pain.  You have fluid, blood, or pus coming from your toenail. This information is not intended to replace advice given to you by your health care provider. Make sure you discuss any questions you have with your health care provider. Document Released: 07/06/2000 Document Revised: 12/09/2015 Document Reviewed: 06/02/2014 Elsevier Interactive Patient Education  2018 [employer] Inc[employer]  

## 2018-01-30 NOTE — Progress Notes (Signed)
Subjective:    Patient ID: Steve Wiggins, male    DOB: 1941/03/05, 77 y.o.   MRN: [id number]  HPI 77 year old male presents the office today for concerns of toenail fungus as well as the nails becoming ingrown.  He states the nails to become painful with pressure in shoes.  He states his blood sugars been high.  He denies any redness or drainage or response to internal sites.  He was on Lamisil 30 years ago for about 30 days from this which did help however the fungus did come back.   Review of Systems  All other systems reviewed and are negative.  Past Medical History:  Diagnosis Date  . Coronary artery disease    a. 07/2010 echo: nl EF;  b. 08/2010 Cath/PCI: Resolute DES to OM  . Diabetes mellitus    type 2  . GERD (gastroesophageal reflux disease)   . History of gastric ulcer   . History of gout   . History of head injury FELL OFF LADDER,  1999--  S/P DRAINAGE SUBDERMAL HEMATOMA--  NO RESIDUAL  . Hyperlipidemia   . Hypertension   . Mild carotid artery disease (HCC)    a. 07/2010 Carotid U/S: bilat ICA 0-35%   . Obesity   . Penile lesion   . Umbilical hernia     Past Surgical History:  Procedure Laterality Date  . CARDIOVASCULAR STRESS TEST  08-28-2010  ([place], [place])   EF 66%/ SMALL AREA PROXIMAL POSTEROLATERAL ISCHEMIA  . cataract surgery    . CHOLECYSTECTOMY  06/05/2012   Procedure: LAPAROSCOPIC CHOLECYSTECTOMY;  Surgeon: Emelia Loron, MD;  Location: [facility] OR;  Service: General;  Laterality: N/A;  . CORONARY ANGIOPLASTY WITH STENT PLACEMENT  09-26-2011  ([place], [place])   DRUG-ELUTING STENT X1 TO FIRST OBTUSE MARGINAL BRANCH CIRCUMFLEX/ MODERATE LESION DIAGINAL BRANCH TREATED MEDICALLY  . SURG FOR DRAINAGE SUBDURAL  HEMATOMA  1999  . TRANSTHORACIC ECHOCARDIOGRAM  08-21-2010   LVF NORMAL/ MILD MITRAL AND TRICUSID REGURG.  . UMBILICAL HERNIA REPAIR  06/05/2012   Procedure: HERNIA REPAIR UMBILICAL ADULT;  Surgeon: Emelia Loron, MD;  Location: [facility] OR;  Service:  General;  Laterality: N/A;  Primary Umbilical Hernia Repair     Current Outpatient Medications:  .  acetaminophen (TYLENOL) 325 MG tablet, Take 650 mg by mouth every 6 (six) hours as needed (pain)., Disp: , Rfl:  .  amLODipine (NORVASC) 10 MG tablet, TAKE 1 TABLET (10 MG TOTAL) BY MOUTH EVERY MORNING., Disp: 90 tablet, Rfl: 3 .  aspirin 81 MG tablet, Take 81 mg by mouth every morning. , Disp: , Rfl:  .  canagliflozin (INVOKANA) 100 MG TABS tablet, Take 1 tablet (100 mg total) by mouth daily before breakfast., Disp: 30 tablet, Rfl: 3 .  carvedilol (COREG) 12.5 MG tablet, TAKE 1 TABLET (12.5 MG TOTAL) BY MOUTH 2 (TWO) TIMES DAILY WITH A MEAL., Disp: 180 tablet, Rfl: 0 .  Dulaglutide (TRULICITY) 1.5 MG/0.5ML SOPN, Inject 1.5 mg into the skin once a week., Disp: 6 pen, Rfl: 1 .  fenofibrate 160 MG tablet, TAKE 1 TABLET (160 MG TOTAL) DAILY BY MOUTH., Disp: 90 tablet, Rfl: 1 .  fluticasone (FLONASE) 50 MCG/ACT nasal spray, Place 2 sprays into both nostrils daily. (Patient taking differently: Place 2 sprays into both nostrils as needed. ), Disp: 16 g, Rfl: 6 .  glimepiride (AMARYL) 4 MG tablet, TAKE 1 TABLET (4 MG TOTAL) BY MOUTH DAILY BEFORE BREAKFAST., Disp: 90 tablet, Rfl: 1 .  hydrALAZINE (APRESOLINE) 50 MG tablet,  TAKE 1 TABLET (50 MG TOTAL) BY MOUTH 2 (TWO) TIMES DAILY., Disp: 180 tablet, Rfl: 1 .  hydrochlorothiazide (HYDRODIURIL) 25 MG tablet, TAKE 1 TABLET BY MOUTH DAILY, Disp: 90 tablet, Rfl: 1 .  Lancets (ONETOUCH ULTRASOFT) lancets, TEST SUGARS TWICE A DAY .DX. E11.40, Disp: 100 each, Rfl: 2 .  levothyroxine (SYNTHROID, LEVOTHROID) 50 MCG tablet, TAKE 1 TABLET (50 MCG TOTAL) BY MOUTH DAILY., Disp: 90 tablet, Rfl: 1 .  lisinopril (PRINIVIL,ZESTRIL) 40 MG tablet, TAKE 1 TABLET BY MOUTH EVERY MORNING, Disp: 90 tablet, Rfl: 2 .  omeprazole (PRILOSEC) 40 MG capsule, TAKE 1 CAPSULE BY MOUTH EVERY EVENING., Disp: 90 capsule, Rfl: 1 .  ONE TOUCH ULTRA TEST test strip, USE TO CHECK SUGARS TWICE  DAILY. DX E11.40, Disp: 100 each, Rfl: 4 .  PAZEO 0.7 % SOLN, , Disp: , Rfl:  .  rosuvastatin (CRESTOR) 10 MG tablet, TAKE 1 TABLET BY MOUTH DAILY, Disp: 90 tablet, Rfl: 2 .  tamsulosin (FLOMAX) 0.4 MG CAPS capsule, Take 0.8 mg by mouth daily. , Disp: , Rfl:   No Known Allergies      Objective:   Physical Exam  General: AAO x3, NAD  Dermatological: Nails are hypertrophic although mild as well as dystrophic with ill-defined discoloration.  There is incurvation along the hallux toenails without any edema, erythema, drainage or pus there is no clinical signs of infection noted today.  There is no open lesions identified.  Vascular: Dorsalis Pedis artery and Posterior Tibial artery pedal pulses are 2/4 bilateral with immedate capillary fill time. There is no pain with calf compression, swelling, warmth, erythema.   Neruologic: Grossly intact via light touch bilateral. . Protective threshold with Semmes Wienstein monofilament intact to all pedal sites bilateral.   Musculoskeletal: No gross boney pedal deformities bilateral. No pain, crepitus, or limitation noted with foot and ankle range of motion bilateral. Muscular strength 5/5 in all groups tested bilateral.      Assessment & Plan:  77 year old male with symptomatic onychomycosis, ingrown toenails -Treatment options discussed including all alternatives, risks, and complications -Etiology of symptoms were discussed -We discussed Lamisil again for total of 90 days.  He like to do this please consider other medications.  We will have his primary care physician before starting this.  Also I debrided his toenails so that any complications or bleeding.  Discussed partial nail avulsion but since his blood sugar has been significantly elevated he states that often procedure due to potential wound healing as there is no signs of infection.  After I debrided the toenails today there is resolution of pain however if you continue to  the painful or if  there is any signs or symptoms of infection will need a partial nail avulsion.  Vivi BarrackMatthew R Wagoner DPM

## 2018-02-03 ENCOUNTER — Telehealth: Payer: Self-pay | Admitting: *Deleted

## 2018-02-03 DIAGNOSIS — Z79899 Other long term (current) drug therapy: Secondary | ICD-10-CM

## 2018-02-03 NOTE — Telephone Encounter (Signed)
Dr. Ardelle AntonWagoner states he spoke with Dr. Beverely Lowabori and she said pt could take the Lamisil. I informed pt of Dr. Ardelle AntonWagoner statement from Dr. Beverely Lowabori and pt states he would like to have the lamisil and have the labs drawn in a week or 2 at Dr. Rennis Goldenabori's. I told pt I would order the labs and send a copy to him to have on hand for his visit with Dr. Beverely Lowabori. I told pt DR. Ardelle AntonWagoner would like to see him again in 6 weeks. Pt states understanding. Mailed copy of labs to be drawn to pt.

## 2018-02-12 ENCOUNTER — Other Ambulatory Visit: Payer: Self-pay | Admitting: Family Medicine

## 2018-02-12 ENCOUNTER — Other Ambulatory Visit: Payer: Self-pay | Admitting: Physician Assistant

## 2018-02-12 DIAGNOSIS — E038 Other specified hypothyroidism: Secondary | ICD-10-CM

## 2018-02-15 ENCOUNTER — Other Ambulatory Visit: Payer: Self-pay | Admitting: Family Medicine

## 2018-02-15 ENCOUNTER — Other Ambulatory Visit: Payer: Self-pay | Admitting: Cardiovascular Disease

## 2018-02-25 ENCOUNTER — Other Ambulatory Visit: Payer: Self-pay | Admitting: Physician Assistant

## 2018-02-27 ENCOUNTER — Encounter: Payer: Self-pay | Admitting: Cardiology

## 2018-02-27 ENCOUNTER — Ambulatory Visit (INDEPENDENT_AMBULATORY_CARE_PROVIDER_SITE_OTHER): Payer: PPO | Admitting: Cardiology

## 2018-02-27 VITALS — BP 140/58 | HR 62 | Ht 68.0 in | Wt 226.8 lb

## 2018-02-27 DIAGNOSIS — E1140 Type 2 diabetes mellitus with diabetic neuropathy, unspecified: Secondary | ICD-10-CM

## 2018-02-27 DIAGNOSIS — I10 Essential (primary) hypertension: Secondary | ICD-10-CM

## 2018-02-27 DIAGNOSIS — I2510 Atherosclerotic heart disease of native coronary artery without angina pectoris: Secondary | ICD-10-CM

## 2018-02-27 DIAGNOSIS — E782 Mixed hyperlipidemia: Secondary | ICD-10-CM

## 2018-02-27 MED ORDER — HYDROCHLOROTHIAZIDE 25 MG PO TABS: 25.0000 mg | ORAL_TABLET | Freq: Every day | ORAL | 3 refills | Status: AC

## 2018-02-27 NOTE — Patient Instructions (Addendum)
Medication Instructions: Your physician recommends that you continue on your current medications as directed. Please refer to the Current Medication list given to you today.   Labwork: None  Procedures/Testing: None  Follow-Up: Your physician recommends that you schedule a follow-up appointment in: 3-4 months with Dr.Cooper or Lizabeth Leyden PA-C   Any Additional Special Instructions Will Be Listed Below (If Applicable).     If you need a refill on your cardiac medications before your next appointment, please call your pharmacy.   Low-Sodium Eating Plan Sodium, which is an element that makes up salt, helps you maintain a healthy balance of fluids in your body. Too much sodium can increase your blood pressure and cause fluid and waste to be held in your body. Your health care provider or dietitian may recommend following this plan if you have high blood pressure (hypertension), kidney disease, liver disease, or heart failure. Eating less sodium can help lower your blood pressure, reduce swelling, and protect your heart, liver, and kidneys. What are tips for following this plan? General guidelines  Most people on this plan should limit their sodium intake to 1,500-2,000 mg (milligrams) of sodium each day. Reading food labels  The Nutrition Facts label lists the amount of sodium in one serving of the food. If you eat more than one serving, you must multiply the listed amount of sodium by the number of servings.  Choose foods with less than 140 mg of sodium per serving.  Avoid foods with 300 mg of sodium or more per serving. Shopping  Look for lower-sodium products, often labeled as "low-sodium" or "no salt added."  Always check the sodium content even if foods are labeled as "unsalted" or "no salt added".  Buy fresh foods. ? Avoid canned foods and premade or frozen meals. ? Avoid canned, cured, or processed meats  Buy breads that have less than 80 mg of sodium per  slice. Cooking  Eat more home-cooked food and less restaurant, buffet, and fast food.  Avoid adding salt when cooking. Use salt-free seasonings or herbs instead of table salt or sea salt. Check with your health care provider or pharmacist before using salt substitutes.  Cook with plant-based oils, such as canola, sunflower, or olive oil. Meal planning  When eating at a restaurant, ask that your food be prepared with less salt or no salt, if possible.  Avoid foods that contain MSG (monosodium glutamate). MSG is sometimes added to [place] food, bouillon, and some canned foods. What foods are recommended? The items listed may not be a complete list. Talk with your dietitian about what dietary choices are best for you. Grains Low-sodium cereals, including oats, puffed wheat and rice, and shredded wheat. Low-sodium crackers. Unsalted rice. Unsalted pasta. Low-sodium bread. Whole-grain breads and whole-grain pasta. Vegetables Fresh or frozen vegetables. "No salt added" canned vegetables. "No salt added" tomato sauce and paste. Low-sodium or reduced-sodium tomato and vegetable juice. Fruits Fresh, frozen, or canned fruit. Fruit juice. Meats and other protein foods Fresh or frozen (no salt added) meat, poultry, seafood, and fish. Low-sodium canned tuna and salmon. Unsalted nuts. Dried peas, beans, and lentils without added salt. Unsalted canned beans. Eggs. Unsalted nut butters. Dairy Milk. Soy milk. Cheese that is naturally low in sodium, such as ricotta cheese, fresh mozzarella, or Swiss cheese Low-sodium or reduced-sodium cheese. Cream cheese. Yogurt. Fats and oils Unsalted butter. Unsalted margarine with no trans fat. Vegetable oils such as canola or olive oils. Seasonings and other foods Fresh and dried herbs and  spices. Salt-free seasonings. Low-sodium mustard and ketchup. Sodium-free salad dressing. Sodium-free light mayonnaise. Fresh or refrigerated horseradish. Lemon juice. Vinegar.  Homemade, reduced-sodium, or low-sodium soups. Unsalted popcorn and pretzels. Low-salt or salt-free chips. What foods are not recommended? The items listed may not be a complete list. Talk with your dietitian about what dietary choices are best for you. Grains Instant hot cereals. Bread stuffing, pancake, and biscuit mixes. Croutons. Seasoned rice or pasta mixes. Noodle soup cups. Boxed or frozen macaroni and cheese. Regular salted crackers. Self-rising flour. Vegetables Sauerkraut, pickled vegetables, and relishes. Olives. [place] fries. Onion rings. Regular canned vegetables (not low-sodium or reduced-sodium). Regular canned tomato sauce and paste (not low-sodium or reduced-sodium). Regular tomato and vegetable juice (not low-sodium or reduced-sodium). Frozen vegetables in sauces. Meats and other protein foods Meat or fish that is salted, canned, smoked, spiced, or pickled. Bacon, ham, sausage, hotdogs, corned beef, chipped beef, packaged lunch meats, salt pork, jerky, pickled herring, anchovies, regular canned tuna, sardines, salted nuts. Dairy Processed cheese and cheese spreads. Cheese curds. Blue cheese. Feta cheese. String cheese. Regular cottage cheese. Buttermilk. Canned milk. Fats and oils Salted butter. Regular margarine. Ghee. Bacon fat. Seasonings and other foods Onion salt, garlic salt, seasoned salt, table salt, and sea salt. Canned and packaged gravies. Worcestershire sauce. Tartar sauce. Barbecue sauce. Teriyaki sauce. Soy sauce, including reduced-sodium. Steak sauce. Fish sauce. Oyster sauce. Cocktail sauce. Horseradish that you find on the shelf. Regular ketchup and mustard. Meat flavorings and tenderizers. Bouillon cubes. Hot sauce and Tabasco sauce. Premade or packaged marinades. Premade or packaged taco seasonings. Relishes. Regular salad dressings. Salsa. Potato and tortilla chips. Corn chips and puffs. Salted popcorn and pretzels. Canned or dried soups. Pizza. Frozen entrees and  pot pies. Summary  Eating less sodium can help lower your blood pressure, reduce swelling, and protect your heart, liver, and kidneys.  Most people on this plan should limit their sodium intake to 1,500-2,000 mg (milligrams) of sodium each day.  Canned, boxed, and frozen foods are high in sodium. Restaurant foods, fast foods, and pizza are also very high in sodium. You also get sodium by adding salt to food.  Try to cook at home, eat more fresh fruits and vegetables, and eat less fast food, canned, processed, or prepared foods. This information is not intended to replace advice given to you by your health care provider. Make sure you discuss any questions you have with your health care provider. Document Released: 12/29/2001 Document Revised: 07/02/2016 Document Reviewed: 07/02/2016 Elsevier Interactive Patient Education  [employer]2018 [employer]

## 2018-02-27 NOTE — Progress Notes (Signed)
Cardiology Office Note:    Date:  02/27/2018   ID:  Steve CanningClarence M Detamore, DOB 21-Nov-1940, MRN [id number]  PCP:  Sheliah Hatchabori, Katherine E, MD  Cardiologist:  Tonny BollmanMichael Cooper, MD  Referring MD: Sheliah Hatchabori, Katherine E, MD   Chief Complaint  Patient presents with  . Follow-up    Hypertension    History of Present Illness:    Steve Wiggins is a 77 y.o. male with a past medical history significant for CAD, hypertension, hyperlipidemia and diabetes.  He had a stress test in 07/2010 for dyspnea on exertion (states he never had chest pain) which was abnormal. It was followed by a diagnostic cath (08/2010) which resulted in PCI to OM. He also had moderately tight stenosis of a diagonal branch, for which medical management was recommended. [facility] [place] of bradycardia on higher dose of coreg.  Last Myoview 05/2016 low risk. Last echo 07/2016 showed normal LV function of 55%, no WM abnormality.   He is here today for medication refill, HCTZ. He has stable dyspnea on exertion with walking. He is walking 15 minutes per day on most days. No chest pain. He has mild LLE edema at the end of the day, resolved by morning. No orthopnea, PND.   Pt noted increased BP over the last month after starting Invokana. He is having an "extreme amount" of urine output. He was told to increase fluid intake when starting the Invokana. He is drinking 1/2-1 gallon of liquid per day  With water, diet orange soda, 4 per day, as well as 3-4 cups of coffee. He admits to eating too much salt. He has had to learn to cook after his wife died. He is compliant with all meds.   Past Medical History:  Diagnosis Date  . Coronary artery disease    a. 07/2010 echo: nl EF;  b. 08/2010 Cath/PCI: Resolute DES to OM  . Diabetes mellitus    type 2  . GERD (gastroesophageal reflux disease)   . History of gastric ulcer   . History of gout   . History of head injury FELL OFF LADDER,  1999--  S/P DRAINAGE SUBDERMAL  HEMATOMA--  NO RESIDUAL  . Hyperlipidemia   . Hypertension   . Mild carotid artery disease (HCC)    a. 07/2010 Carotid U/S: bilat ICA 0-35%   . Obesity   . Penile lesion   . Umbilical hernia     Past Surgical History:  Procedure Laterality Date  . CARDIOVASCULAR STRESS TEST  08-28-2010  [place]   EF 66%/ SMALL AREA PROXIMAL POSTEROLATERAL ISCHEMIA  . cataract surgery    . CHOLECYSTECTOMY  06/05/2012   Procedure: LAPAROSCOPIC CHOLECYSTECTOMY;  Surgeon: Emelia LoronMatthew Wakefield, MD;  [facility];  Service: General;  Laterality: N/A;  . CORONARY ANGIOPLASTY WITH STENT PLACEMENT  09-26-2011  ([place]   DRUG-ELUTING STENT X1 TO FIRST OBTUSE MARGINAL BRANCH CIRCUMFLEX/ MODERATE LESION DIAGINAL BRANCH TREATED MEDICALLY  . SURG FOR DRAINAGE SUBDURAL  HEMATOMA  1999  . TRANSTHORACIC ECHOCARDIOGRAM  08-21-2010   LVF NORMAL/ MILD MITRAL AND TRICUSID REGURG.  . UMBILICAL HERNIA REPAIR  06/05/2012   Procedure: HERNIA REPAIR UMBILICAL ADULT;  Surgeon: Emelia LoronMatthew Wakefield, MD;  Location: [facility] OR;  Service: General;  Laterality: N/A;  Primary Umbilical Hernia Repair    Current Medications: Current Meds  Medication Sig  . acetaminophen (TYLENOL) 325 MG tablet Take 650 mg by mouth every 6 (six) hours as needed (pain).  Marland Kitchen. amLODipine (NORVASC) 10 MG tablet TAKE  1 TABLET (10 MG TOTAL) BY MOUTH EVERY MORNING.  Marland Kitchen aspirin 81 MG tablet Take 81 mg by mouth every morning.   . canagliflozin (INVOKANA) 100 MG TABS tablet Take 1 tablet (100 mg total) by mouth daily before breakfast.  . carvedilol (COREG) 12.5 MG tablet TAKE 1 TABLET (12.5 MG TOTAL) BY MOUTH 2 (TWO) TIMES DAILY WITH A MEAL.  . Dulaglutide (TRULICITY) 1.5 MG/0.5ML SOPN Inject 1.5 mg into the skin once a week.  . fenofibrate 160 MG tablet TAKE 1 TABLET (160 MG TOTAL) DAILY BY MOUTH.  . fluticasone (FLONASE) 50 MCG/ACT nasal spray Place 2 sprays into both nostrils daily. (Patient taking differently: Place 2 sprays into both nostrils as needed.  )  . glimepiride (AMARYL) 4 MG tablet TAKE 1 TABLET (4 MG TOTAL) BY MOUTH DAILY BEFORE BREAKFAST.  . hydrALAZINE (APRESOLINE) 50 MG tablet TAKE 1 TABLET (50 MG TOTAL) BY MOUTH 2 (TWO) TIMES DAILY.  . hydrochlorothiazide (HYDRODIURIL) 25 MG tablet Take 1 tablet (25 mg total) by mouth daily.  . Lancets (ONETOUCH ULTRASOFT) lancets TEST SUGARS TWICE A DAY .DX. E11.40  . levothyroxine (SYNTHROID, LEVOTHROID) 50 MCG tablet TAKE 1 TABLET (50 MCG TOTAL) BY MOUTH DAILY.  Marland Kitchen lisinopril (PRINIVIL,ZESTRIL) 40 MG tablet TAKE 1 TABLET BY MOUTH EVERY MORNING  . metFORMIN (GLUCOPHAGE) 1000 MG tablet TAKE 1 TABLET BY MOUTH TWICE A DAY  . omeprazole (PRILOSEC) 40 MG capsule TAKE 1 CAPSULE BY MOUTH EVERY EVENING.  . ONE TOUCH ULTRA TEST test strip USE TO CHECK SUGARS TWICE DAILY. DX E11.40  . PAZEO 0.7 % SOLN   . rosuvastatin (CRESTOR) 10 MG tablet TAKE 1 TABLET BY MOUTH DAILY  . tamsulosin (FLOMAX) 0.4 MG CAPS capsule Take 0.8 mg by mouth daily.   . [DISCONTINUED] hydrochlorothiazide (HYDRODIURIL) 25 MG tablet Take 1 tablet (25 mg total) by mouth daily. Please make yearly appt with Dr. Excell Seltzer for August before anymore refills. 1st attempt     Allergies:   Patient has no known allergies.   Social History   Socioeconomic History  . Marital status: Widowed    Spouse name: Not on file  . Number of children: Not on file  . Years of education: Not on file  . Highest education level: Not on file  Occupational History  . Not on file  Social Needs  . Financial resource strain: Not on file  . Food insecurity:    Worry: Not on file    Inability: Not on file  . Transportation needs:    Medical: Not on file    Non-medical: Not on file  Tobacco Use  . Smoking status: Former Games developer  . Smokeless tobacco: Never Used  . Tobacco comment: quit over 50 yrs ago.  Substance and Sexual Activity  . Alcohol use: No    Comment: previously had an occasional drink but nothing in 50 yrs.  . Drug use: No  . Sexual  activity: Not on file  Lifestyle  . Physical activity:    Days per week: Not on file    Minutes per session: Not on file  . Stress: Not on file  Relationships  . Social connections:    Talks on phone: Not on file    Gets together: Not on file    Attends religious service: Not on file    Active member of club or organization: Not on file    Attends meetings of clubs or organizations: Not on file    Relationship status: Not on file  Other  Topics Concern  . Not on file  Social History Narrative   Lives in [place] with his wife.     Family History: The patient's family history includes Diabetes in his mother; Heart attack in his maternal grandmother and mother; Heart disease in his son; Hypertension in his son; Mental retardation in his brother; Stroke in his mother. ROS:   Please see the history of present illness.     All other systems reviewed and are negative.  EKGs/Labs/Other Studies Reviewed:    The following studies were reviewed today:  Echocardiogram 08/03/2016 Study Conclusions - Left ventricle: The cavity size was normal. Systolic function was   normal. The estimated ejection fraction was 55%. Wall motion was   normal; there were no regional wall motion abnormalities. Left   ventricular diastolic function parameters were normal. - Left atrium: The atrium was mildly dilated. - Atrial septum: No defect or patent foramen ovale was identified.  EKG:  EKG is not ordered today.    Recent Labs: 01/16/2018: ALT 27; BUN 12; Creatinine, Ser 0.93; Hemoglobin 13.4; Platelets 136.0; Potassium 4.7; Sodium 139; TSH 3.82   Recent Lipid Panel    Component Value Date/Time   CHOL 110 01/16/2018 1031   TRIG 200.0 (H) 01/16/2018 1031   HDL 28.40 (L) 01/16/2018 1031   CHOLHDL 4 01/16/2018 1031   VLDL 40.0 01/16/2018 1031   LDLCALC 42 01/16/2018 1031   LDLDIRECT 58.0 05/21/2017 0835    Physical Exam:    VS:  BP (!) 156/70, repeat 140/58   Pulse 62   Ht 5\' 8"  (1.727 m)   Wt 226  lb 12.8 oz (102.9 kg)   SpO2 95%   BMI 34.48 kg/m     Wt Readings from Last 3 Encounters:  02/27/18 226 lb 12.8 oz (102.9 kg)  01/16/18 231 lb 6 oz (105 kg)  01/16/18 231 lb 6 oz (105 kg)     Physical Exam  Constitutional: He is oriented to person, place, and time. He appears well-developed and well-nourished. No distress.  HENT:  Head: Normocephalic and atraumatic.  Neck: Normal range of motion. Neck supple. No JVD present.  Cardiovascular: Normal rate, regular rhythm, normal heart sounds and intact distal pulses. Exam reveals no gallop and no friction rub.  No murmur heard. Pulmonary/Chest: Effort normal and breath sounds normal. No respiratory distress. He has no wheezes. He has no rales.  Abdominal: Soft. Bowel sounds are normal. He exhibits no distension.  Musculoskeletal: Normal range of motion. He exhibits no edema or deformity.  Neurological: He is alert and oriented to person, place, and time.  Skin: Skin is warm and dry.  Psychiatric: He has a normal mood and affect. His behavior is normal. Judgment and thought content normal.  Vitals reviewed.   ASSESSMENT:    1. Atherosclerosis of native coronary artery of native heart without angina pectoris   2. Essential hypertension   3. Mixed hyperlipidemia   4. Type 2 diabetes mellitus with diabetic neuropathy, without long-term current use of insulin (HCC)    PLAN:    In order of problems listed above:  CAD: S/P DES to OM in 2012.  Stable dyspnea on exertion.  No chest pain. Continue medical therapy with aspirin, beta-blocker, ACE inhibitor, statin.  Hypertension: On amlodipine 10 mg carvedilol 12.5 mg, hydralazine 50 mg twice daily, hydrochlorothiazide 25 mg, lisinopril 40 mg Recent labs per primary care with normal renal function and potassium. BP is elevated since starting Invokana and pt has greatly increased his fluid  intake to a couple of gallons per day. Advised to decrease his fluid intake to at least 2 liters per  day, cut back on diet soda and coffee. Avoid dehydration, but don't over do the fluid intake.  Repeat BP in the office today was140/58. He has an appt with his PCP next week. If BP continues to run high, will refer to our hypertension clinic. Decrease sodium intake.   Hyperlipidemia: Rosuvastatin 10 mg.  Recent lipid panel 01/16/2018 showed LDL of 42 which is at goal of <70.  Continue current therapy.  Diabetes type 2: Management per PCP. A1c 8.7 on 6/27/189. Recent addition of Invokana.    Medication Adjustments/Labs and Tests Ordered: Current medicines are reviewed at length with the patient today.  Concerns regarding medicines are outlined above. Labs and tests ordered and medication changes are outlined in the patient instructions below:  Patient Instructions  Medication Instructions: Your physician recommends that you continue on your current medications as directed. Please refer to the Current Medication list given to you today.   Labwork: None  Procedures/Testing: None  Follow-Up: Your physician recommends that you schedule a follow-up appointment in: 3-4 months with Dr.Cooper or Lizabeth Leyden PA-C   Any Additional Special Instructions Will Be Listed Below (If Applicable).     If you need a refill on your cardiac medications before your next appointment, please call your pharmacy.   Low-Sodium Eating Plan Sodium, which is an element that makes up salt, helps you maintain a healthy balance of fluids in your body. Too much sodium can increase your blood pressure and cause fluid and waste to be held in your body. Your health care provider or dietitian may recommend following this plan if you have high blood pressure (hypertension), kidney disease, liver disease, or heart failure. Eating less sodium can help lower your blood pressure, reduce swelling, and protect your heart, liver, and kidneys. What are tips for following this plan? General guidelines  Most people on this plan  should limit their sodium intake to 1,500-2,000 mg (milligrams) of sodium each day. Reading food labels  The Nutrition Facts label lists the amount of sodium in one serving of the food. If you eat more than one serving, you must multiply the listed amount of sodium by the number of servings.  Choose foods with less than 140 mg of sodium per serving.  Avoid foods with 300 mg of sodium or more per serving. Shopping  Look for lower-sodium products, often labeled as "low-sodium" or "no salt added."  Always check the sodium content even if foods are labeled as "unsalted" or "no salt added".  Buy fresh foods. ? Avoid canned foods and premade or frozen meals. ? Avoid canned, cured, or processed meats  Buy breads that have less than 80 mg of sodium per slice. Cooking  Eat more home-cooked food and less restaurant, buffet, and fast food.  Avoid adding salt when cooking. Use salt-free seasonings or herbs instead of table salt or sea salt. Check with your health care provider or pharmacist before using salt substitutes.  Cook with plant-based oils, such as canola, sunflower, or olive oil. Meal planning  When eating at a restaurant, ask that your food be prepared with less salt or no salt, if possible.  Avoid foods that contain MSG (monosodium glutamate). MSG is sometimes added to [place] food, bouillon, and some canned foods. What foods are recommended? The items listed may not be a complete list. Talk with your dietitian about what dietary choices are  best for you. Grains Low-sodium cereals, including oats, puffed wheat and rice, and shredded wheat. Low-sodium crackers. Unsalted rice. Unsalted pasta. Low-sodium bread. Whole-grain breads and whole-grain pasta. Vegetables Fresh or frozen vegetables. "No salt added" canned vegetables. "No salt added" tomato sauce and paste. Low-sodium or reduced-sodium tomato and vegetable juice. Fruits Fresh, frozen, or canned fruit. Fruit juice. Meats and  other protein foods Fresh or frozen (no salt added) meat, poultry, seafood, and fish. Low-sodium canned tuna and salmon. Unsalted nuts. Dried peas, beans, and lentils without added salt. Unsalted canned beans. Eggs. Unsalted nut butters. Dairy Milk. Soy milk. Cheese that is naturally low in sodium, such as ricotta cheese, fresh mozzarella, or Swiss cheese Low-sodium or reduced-sodium cheese. Cream cheese. Yogurt. Fats and oils Unsalted butter. Unsalted margarine with no trans fat. Vegetable oils such as canola or olive oils. Seasonings and other foods Fresh and dried herbs and spices. Salt-free seasonings. Low-sodium mustard and ketchup. Sodium-free salad dressing. Sodium-free light mayonnaise. Fresh or refrigerated horseradish. Lemon juice. Vinegar. Homemade, reduced-sodium, or low-sodium soups. Unsalted popcorn and pretzels. Low-salt or salt-free chips. What foods are not recommended? The items listed may not be a complete list. Talk with your dietitian about what dietary choices are best for you. Grains Instant hot cereals. Bread stuffing, pancake, and biscuit mixes. Croutons. Seasoned rice or pasta mixes. Noodle soup cups. Boxed or frozen macaroni and cheese. Regular salted crackers. Self-rising flour. Vegetables Sauerkraut, pickled vegetables, and relishes. Olives. [place] fries. Onion rings. Regular canned vegetables (not low-sodium or reduced-sodium). Regular canned tomato sauce and paste (not low-sodium or reduced-sodium). Regular tomato and vegetable juice (not low-sodium or reduced-sodium). Frozen vegetables in sauces. Meats and other protein foods Meat or fish that is salted, canned, smoked, spiced, or pickled. Bacon, ham, sausage, hotdogs, corned beef, chipped beef, packaged lunch meats, salt pork, jerky, pickled herring, anchovies, regular canned tuna, sardines, salted nuts. Dairy Processed cheese and cheese spreads. Cheese curds. Blue cheese. Feta cheese. String cheese. Regular cottage  cheese. Buttermilk. Canned milk. Fats and oils Salted butter. Regular margarine. Ghee. Bacon fat. Seasonings and other foods Onion salt, garlic salt, seasoned salt, table salt, and sea salt. Canned and packaged gravies. Worcestershire sauce. Tartar sauce. Barbecue sauce. Teriyaki sauce. Soy sauce, including reduced-sodium. Steak sauce. Fish sauce. Oyster sauce. Cocktail sauce. Horseradish that you find on the shelf. Regular ketchup and mustard. Meat flavorings and tenderizers. Bouillon cubes. Hot sauce and Tabasco sauce. Premade or packaged marinades. Premade or packaged taco seasonings. Relishes. Regular salad dressings. Salsa. Potato and tortilla chips. Corn chips and puffs. Salted popcorn and pretzels. Canned or dried soups. Pizza. Frozen entrees and pot pies. Summary  Eating less sodium can help lower your blood pressure, reduce swelling, and protect your heart, liver, and kidneys.  Most people on this plan should limit their sodium intake to 1,500-2,000 mg (milligrams) of sodium each day.  Canned, boxed, and frozen foods are high in sodium. Restaurant foods, fast foods, and pizza are also very high in sodium. You also get sodium by adding salt to food.  Try to cook at home, eat more fresh fruits and vegetables, and eat less fast food, canned, processed, or prepared foods. This information is not intended to replace advice given to you by your health care provider. Make sure you discuss any questions you have with your health care provider. Document Released: 12/29/2001 Document Revised: 07/02/2016 Document Reviewed: 07/02/2016 Elsevier Interactive Patient Education  [employer].     Signed, Berton Bon, NP  02/27/2018 12:20  PM    [facility]

## 2018-03-06 ENCOUNTER — Ambulatory Visit (INDEPENDENT_AMBULATORY_CARE_PROVIDER_SITE_OTHER): Payer: PPO | Admitting: Family Medicine

## 2018-03-06 ENCOUNTER — Encounter: Payer: Self-pay | Admitting: Family Medicine

## 2018-03-06 ENCOUNTER — Other Ambulatory Visit: Payer: Self-pay

## 2018-03-06 VITALS — BP 134/82 | HR 55 | Temp 98.3°F | Resp 17 | Ht 68.0 in | Wt 225.5 lb

## 2018-03-06 DIAGNOSIS — I10 Essential (primary) hypertension: Secondary | ICD-10-CM

## 2018-03-06 DIAGNOSIS — E1140 Type 2 diabetes mellitus with diabetic neuropathy, unspecified: Secondary | ICD-10-CM | POA: Diagnosis not present

## 2018-03-06 DIAGNOSIS — Z79899 Other long term (current) drug therapy: Secondary | ICD-10-CM | POA: Diagnosis not present

## 2018-03-06 LAB — BASIC METABOLIC PANEL
BUN: 16 mg/dL (ref 6–23)
CALCIUM: 9.8 mg/dL (ref 8.4–10.5)
CO2: 26 mEq/L (ref 19–32)
CREATININE: 1.1 mg/dL (ref 0.40–1.50)
Chloride: 104 mEq/L (ref 96–112)
GFR: 69.43 mL/min (ref >60.00)
GLUCOSE: 187 mg/dL — AB (ref 70–99)
Potassium: 4.4 mEq/L (ref 3.5–5.1)
SODIUM: 137 meq/L (ref 135–145)

## 2018-03-06 LAB — HEPATIC FUNCTION PANEL
ALT: 28 U/L (ref 0–53)
AST: 25 U/L (ref 0–37)
Albumin: 4.4 g/dL (ref 3.5–5.2)
Alkaline Phosphatase: 43 U/L (ref 39–117)
BILIRUBIN DIRECT: 0.1 mg/dL (ref 0.0–0.3)
BILIRUBIN TOTAL: 0.7 mg/dL (ref 0.2–1.2)
Total Protein: 7 g/dL (ref 6.0–8.3)

## 2018-03-06 LAB — CBC WITH DIFFERENTIAL/PLATELET
BASOS ABS: 0 10*3/uL (ref 0.0–0.1)
Basophils Relative: 0.8 % (ref 0.0–3.0)
EOS ABS: 0.1 10*3/uL (ref 0.0–0.7)
Eosinophils Relative: 2.3 % (ref 0.0–5.0)
HCT: 42.9 % (ref 39.0–52.0)
Hemoglobin: 14.7 g/dL (ref 13.0–17.0)
LYMPHS ABS: 1.8 10*3/uL (ref 0.7–4.0)
Lymphocytes Relative: 29.7 % (ref 12.0–46.0)
MCHC: 34.2 g/dL (ref 30.0–36.0)
MCV: 91.6 fl (ref 78.0–100.0)
MONO ABS: 0.6 10*3/uL (ref 0.1–1.0)
MONOS PCT: 9.1 % (ref 3.0–12.0)
NEUTROS PCT: 58.1 % (ref 43.0–77.0)
Neutro Abs: 3.5 10*3/uL (ref 1.4–7.7)
Platelets: 140 10*3/uL — ABNORMAL LOW (ref 150.0–400.0)
RBC: 4.69 Mil/uL (ref 4.22–5.81)
RDW: 13.4 % (ref 11.5–15.5)
WBC: 6.1 10*3/uL (ref 4.0–10.5)

## 2018-03-06 NOTE — Progress Notes (Signed)
   Subjective:    Patient ID: Steve Wiggins, male    DOB: 1940-09-20, 77 y.o.   MRN: [id number]  HPI DM- chronic problem, Amaryl 4mg  daily, Trulicity 1.5mg , Invokana 100mg .  Unfortunately last A1C was still 8.8  CBGs running 140-230s.  Diet has not changed.  Invokana is causing increased urination (in addition to his Flomax)  Pt is considering once daily insulin.  UTD on eye exam, on ACE for renal protection.  UTD on foot exam.  Denies symptomatic lows.  HTN- chronic problem, on Amlodipine 10mg , Coreg 12.5mg  BID, Hydralazine 50mg  BID, Lisinopril 40mg  daily.  BP remains mildly elevated.  No CP, SOB, HAs, visual changes.    Review of Systems For ROS see HPI     Objective:   Physical Exam  Constitutional: He is oriented to person, place, and time. He appears well-developed and well-nourished. No distress.  HENT:  Head: Normocephalic and atraumatic.  Eyes: Pupils are equal, round, and reactive to light. Conjunctivae and EOM are normal.  Neck: Normal range of motion. Neck supple. No thyromegaly present.  Cardiovascular: Normal rate, regular rhythm, normal heart sounds and intact distal pulses.  No murmur heard. Pulmonary/Chest: Effort normal and breath sounds normal. No respiratory distress.  Abdominal: Soft. Bowel sounds are normal. He exhibits no distension.  Musculoskeletal: He exhibits no edema.  Lymphadenopathy:    He has no cervical adenopathy.  Neurological: He is alert and oriented to person, place, and time. No cranial nerve deficit.  Skin: Skin is warm and dry.  Psychiatric: He has a normal mood and affect. His behavior is normal.  Vitals reviewed.         Assessment & Plan:

## 2018-03-06 NOTE — Patient Instructions (Addendum)
Follow up in 4-6 weeks to recheck sugars We'll notify you of your lab results and make any changes if needed For now, no med changes but we'll consider switching to that once daily insulin Call with any questions or concerns Hang in there!!!

## 2018-03-06 NOTE — Assessment & Plan Note (Signed)
Chronic problem.  Deteriorated.  Pt is having a difficult time controlling his sugars despite changing medications.  He stopped Metformin due to GI upset and Invokana is causing extreme urinary frequency (when coupled w/ BPH and Flomax).  Initially Trulicity was doing a great job w/ sugar control.  We will check Fructosamine today to see if Theodis Satonvokana has made any improvements.  If no change in A1C, will stop Invokana and start low dose daily Lantus.  Pt expressed understanding and is in agreement w/ plan.

## 2018-03-06 NOTE — Assessment & Plan Note (Signed)
BP was initially elevated but improved by end of visit.  Asymptomatic.  Check labs.  No med changes at this time.

## 2018-03-08 ENCOUNTER — Other Ambulatory Visit: Payer: Self-pay | Admitting: Cardiovascular Disease

## 2018-03-10 ENCOUNTER — Telehealth: Payer: Self-pay | Admitting: Family Medicine

## 2018-03-10 ENCOUNTER — Other Ambulatory Visit: Payer: Self-pay | Admitting: General Practice

## 2018-03-10 LAB — EXTRA LAV TOP TUBE

## 2018-03-10 LAB — FRUCTOSAMINE: FRUCTOSAMINE: 269 umol/L (ref 190–270)

## 2018-03-10 MED ORDER — EMPAGLIFLOZIN 10 MG PO TABS: 10.0000 mg | ORAL_TABLET | Freq: Every day | ORAL | 6 refills | Status: AC

## 2018-03-10 NOTE — Telephone Encounter (Signed)
Pt made aware. And stated an understanding.

## 2018-03-10 NOTE — Telephone Encounter (Signed)
We are still waiting on his Fructosamine level.  I promise we did not forget!  Just waiting on the lab

## 2018-03-10 NOTE — Telephone Encounter (Signed)
Please advise? I see where the results are back but no documentation

## 2018-03-10 NOTE — Telephone Encounter (Signed)
Copied from CRM [id number]. Topic: Quick Communication - See Telephone Encounter >> Mar 10, 2018 12:10 PM Terisa Starraylor, Brittany L wrote: CRM for notification. See Telephone encounter for: 03/10/18.  Patient states that he was in the office on 8/15 and Dr Beverely Lowabori told him to stop his canagliflozin (INVOKANA) 100 MG TABS tablet. He thought that he would start insulin but has not seen anything yet on his mychart yet & wants to know what he needs to do? Please advise.

## 2018-03-18 ENCOUNTER — Other Ambulatory Visit: Payer: Self-pay | Admitting: Family Medicine

## 2018-03-20 ENCOUNTER — Other Ambulatory Visit: Payer: Self-pay | Admitting: Physician Assistant

## 2018-04-04 ENCOUNTER — Other Ambulatory Visit: Payer: Self-pay | Admitting: Cardiovascular Disease

## 2018-04-06 ENCOUNTER — Other Ambulatory Visit: Payer: Self-pay | Admitting: Cardiovascular Disease

## 2018-04-16 ENCOUNTER — Encounter: Payer: Self-pay | Admitting: Family Medicine

## 2018-04-16 ENCOUNTER — Other Ambulatory Visit: Payer: Self-pay

## 2018-04-16 ENCOUNTER — Ambulatory Visit (INDEPENDENT_AMBULATORY_CARE_PROVIDER_SITE_OTHER): Payer: PPO | Admitting: Family Medicine

## 2018-04-16 VITALS — BP 134/80 | HR 82 | Temp 98.1°F | Resp 16 | Ht 68.0 in | Wt 222.4 lb

## 2018-04-16 DIAGNOSIS — E1140 Type 2 diabetes mellitus with diabetic neuropathy, unspecified: Secondary | ICD-10-CM | POA: Diagnosis not present

## 2018-04-16 LAB — BASIC METABOLIC PANEL
BUN: 19 mg/dL (ref 6–23)
CHLORIDE: 105 meq/L (ref 96–112)
CO2: 25 mEq/L (ref 19–32)
CREATININE: 1.3 mg/dL (ref 0.40–1.50)
Calcium: 9.5 mg/dL (ref 8.4–10.5)
GFR: 57.24 mL/min — AB (ref >60.00)
Glucose, Bld: 170 mg/dL — ABNORMAL HIGH (ref 70–99)
POTASSIUM: 4.3 meq/L (ref 3.5–5.1)
Sodium: 138 mEq/L (ref 135–145)

## 2018-04-16 LAB — HEMOGLOBIN A1C: HEMOGLOBIN A1C: 7.9 % — AB (ref 4.6–6.5)

## 2018-04-16 NOTE — Patient Instructions (Addendum)
Schedule your complete physical in 3-4 months We'll notify you of your lab results and make any changes if needed Continue to work on healthy diet and regular exercise- you're doing great!  Down 4 lbs! Call with any questions or concerns Happy Fall!!!

## 2018-04-16 NOTE — Assessment & Plan Note (Signed)
Pt feels better since switching from Invokana to [place].  UTD on eye exam, foot exam, and on ACE for renal protection.  Home readings have improved.  Check labs.  Adjust meds prn

## 2018-04-16 NOTE — Progress Notes (Signed)
   Subjective:    Patient ID: Steve Wiggins, male    DOB: 03/28/1941, 77 y.o.   MRN: [id number]  HPI DM- chronic problem, on Glimepiride 4mg  daily, Trulicity, and Jardiance 10mg  daily.  Home CBGs range 148-250.  Most readings in the 180s-190s.  UTD on eye exam, foot exam, and on ACE for renal protection.  Pt reports his urinary frequency is somewhat less since switching to Jardiance and overall feels better.  Pt is down 4 lbs since last visit.  No CP, SOB, HAs, visual changes.  No numbness/tingling of hands feet.  No abd pain, N/V/D.   Review of Systems For ROS see HPI     Objective:   Physical Exam  Constitutional: He is oriented to person, place, and time. He appears well-developed and well-nourished. No distress.  HENT:  Head: Normocephalic and atraumatic.  Eyes: Pupils are equal, round, and reactive to light. Conjunctivae and EOM are normal.  Neck: Normal range of motion. Neck supple. No thyromegaly present.  Cardiovascular: Normal rate, regular rhythm, normal heart sounds and intact distal pulses.  No murmur heard. Pulmonary/Chest: Effort normal and breath sounds normal. No respiratory distress.  Abdominal: Soft. Bowel sounds are normal. He exhibits no distension.  Musculoskeletal: He exhibits no edema.  Lymphadenopathy:    He has no cervical adenopathy.  Neurological: He is alert and oriented to person, place, and time. No cranial nerve deficit.  Skin: Skin is warm and dry.  Psychiatric: He has a normal mood and affect. His behavior is normal.  Vitals reviewed.         Assessment & Plan:

## 2018-04-21 ENCOUNTER — Other Ambulatory Visit: Payer: Self-pay | Admitting: Family Medicine

## 2018-05-15 ENCOUNTER — Other Ambulatory Visit: Payer: Self-pay | Admitting: Cardiovascular Disease

## 2018-05-24 ENCOUNTER — Other Ambulatory Visit: Payer: Self-pay | Admitting: Family Medicine

## 2018-05-29 ENCOUNTER — Other Ambulatory Visit: Payer: Self-pay | Admitting: Family Medicine

## 2018-05-31 ENCOUNTER — Other Ambulatory Visit: Payer: Self-pay | Admitting: Family Medicine

## 2018-06-26 DIAGNOSIS — H40033 Anatomical narrow angle, bilateral: Secondary | ICD-10-CM | POA: Diagnosis not present

## 2018-06-26 DIAGNOSIS — H43393 Other vitreous opacities, bilateral: Secondary | ICD-10-CM | POA: Diagnosis not present

## 2018-06-30 ENCOUNTER — Ambulatory Visit: Payer: PPO | Admitting: Cardiovascular Disease

## 2018-06-30 DIAGNOSIS — H1013 Acute atopic conjunctivitis, bilateral: Secondary | ICD-10-CM | POA: Diagnosis not present

## 2018-07-29 ENCOUNTER — Telehealth: Payer: Self-pay | Admitting: Family Medicine

## 2018-07-29 NOTE — Telephone Encounter (Signed)
Copied from CRM [id number]. Topic: Quick Communication - See Telephone Encounter >> Jul 29, 2018  4:41 PM Terisa Starr wrote: CRM for notification. See Telephone encounter for: 07/29/18.  Patient states that ever since Dr Beverely Low put him on empagliflozin (JARDIANCE) 10 MG TABS tablet that his sugar runs around 160 - 170. He also wanted to let Dr Beverely Low know that he has moved about 160 miles away and is living with his doctor so he will not be back to the office and has found a new doctor and will see that doctor at the end of the month

## 2018-07-29 NOTE — Telephone Encounter (Signed)
FYI

## 2018-07-30 NOTE — Telephone Encounter (Signed)
I'm glad that the sugars are better!  Please give him my best!  (and remove me as PCP)

## 2018-07-30 NOTE — Telephone Encounter (Signed)
Noted, PCP removed.

## 2018-08-18 ENCOUNTER — Other Ambulatory Visit: Payer: Self-pay | Admitting: Family Medicine

## 2018-08-27 ENCOUNTER — Encounter: Payer: PPO | Admitting: Family Medicine
